# Patient Record
Sex: Male | Born: 1955 | Race: White | Hispanic: No | Marital: Married | Smoking: Former smoker
Health system: Southern US, Community
[De-identification: ages and names within clinical notes are randomized; demographics above are authoritative.]

## PROBLEM LIST (undated history)

## (undated) DIAGNOSIS — K635 Polyp of colon: Secondary | ICD-10-CM

## (undated) DIAGNOSIS — E291 Testicular hypofunction: Secondary | ICD-10-CM

## (undated) DIAGNOSIS — E8881 Metabolic syndrome: Secondary | ICD-10-CM

## (undated) DIAGNOSIS — E669 Obesity, unspecified: Secondary | ICD-10-CM

## (undated) DIAGNOSIS — G4733 Obstructive sleep apnea (adult) (pediatric): Secondary | ICD-10-CM

## (undated) DIAGNOSIS — R7301 Impaired fasting glucose: Secondary | ICD-10-CM

## (undated) DIAGNOSIS — I10 Essential (primary) hypertension: Secondary | ICD-10-CM

## (undated) DIAGNOSIS — M109 Gout, unspecified: Secondary | ICD-10-CM

## (undated) DIAGNOSIS — E785 Hyperlipidemia, unspecified: Secondary | ICD-10-CM

## (undated) DIAGNOSIS — J45909 Unspecified asthma, uncomplicated: Secondary | ICD-10-CM

## (undated) HISTORY — DX: Metabolic syndrome: E88.81

## (undated) HISTORY — DX: Polyp of colon: K63.5

## (undated) HISTORY — DX: Testicular hypofunction: E29.1

## (undated) HISTORY — DX: Obesity, unspecified: E66.9

## (undated) HISTORY — DX: Essential (primary) hypertension: I10

## (undated) HISTORY — DX: Obstructive sleep apnea (adult) (pediatric): G47.33

## (undated) HISTORY — DX: Hyperlipidemia, unspecified: E78.5

## (undated) HISTORY — PX: WISDOM TOOTH EXTRACTION: SHX21

## (undated) HISTORY — DX: Gout, unspecified: M10.9

## (undated) HISTORY — DX: Impaired fasting glucose: R73.01

---

## 2009-01-02 ENCOUNTER — Encounter (INDEPENDENT_AMBULATORY_CARE_PROVIDER_SITE_OTHER): Payer: Self-pay | Admitting: *Deleted

## 2009-09-02 ENCOUNTER — Telehealth (INDEPENDENT_AMBULATORY_CARE_PROVIDER_SITE_OTHER): Payer: Self-pay | Admitting: *Deleted

## 2010-05-12 NOTE — Progress Notes (Signed)
Summary: Schedule recall colonoscopy  Phone Note Outgoing Call Call back at [facility] Phone [phone]   Call placed by: Christie Nottingham CMA Duncan Dull),  Sep 02, 2009 11:54 AM Call placed to: Patient Summary of Call: Called pt to schedule recall colonoscopy and left a message with spouse for pt to call our office.  Initial call taken by: Christie Nottingham CMA Duncan Dull),  Sep 02, 2009 11:55 AM  Follow-up for Phone Call        left message for pt  to call back  Follow-up by: Christie Nottingham CMA Duncan Dull),  September 10, 2009 10:44 AM

## 2011-12-11 ENCOUNTER — Emergency Department (HOSPITAL_BASED_OUTPATIENT_CLINIC_OR_DEPARTMENT_OTHER): Payer: BC Managed Care – PPO

## 2011-12-11 ENCOUNTER — Encounter (HOSPITAL_BASED_OUTPATIENT_CLINIC_OR_DEPARTMENT_OTHER): Payer: Self-pay | Admitting: Emergency Medicine

## 2011-12-11 ENCOUNTER — Emergency Department (HOSPITAL_BASED_OUTPATIENT_CLINIC_OR_DEPARTMENT_OTHER)
Admission: EM | Admit: 2011-12-11 | Discharge: 2011-12-11 | Disposition: A | Discharge status: Home / Self Care | Payer: BC Managed Care – PPO | Attending: Emergency Medicine | Admitting: Emergency Medicine

## 2011-12-11 DIAGNOSIS — J45909 Unspecified asthma, uncomplicated: Secondary | ICD-10-CM | POA: Insufficient documentation

## 2011-12-11 DIAGNOSIS — Y998 Other external cause status: Secondary | ICD-10-CM | POA: Insufficient documentation

## 2011-12-11 DIAGNOSIS — S52123A Displaced fracture of head of unspecified radius, initial encounter for closed fracture: Secondary | ICD-10-CM | POA: Insufficient documentation

## 2011-12-11 DIAGNOSIS — Y9389 Activity, other specified: Secondary | ICD-10-CM | POA: Insufficient documentation

## 2011-12-11 DIAGNOSIS — IMO0002 Reserved for concepts with insufficient information to code with codable children: Secondary | ICD-10-CM | POA: Insufficient documentation

## 2011-12-11 HISTORY — DX: Unspecified asthma, uncomplicated: J45.909

## 2011-12-11 MED ORDER — OXYCODONE-ACETAMINOPHEN 5-325 MG PO TABS: 1.0000 | ORAL_TABLET | ORAL | Status: AC | PRN

## 2011-12-11 MED ORDER — TETANUS-DIPHTH-ACELL PERTUSSIS 5-2.5-18.5 LF-MCG/0.5 IM SUSP
0.5000 mL | Freq: Once | INTRAMUSCULAR | Status: AC
  Administered 2011-12-11: 0.5 mL via INTRAMUSCULAR
  Filled 2011-12-11: qty 0.5

## 2011-12-11 MED ORDER — OXYCODONE-ACETAMINOPHEN 5-325 MG PO TABS
2.0000 | ORAL_TABLET | Freq: Once | ORAL | Status: AC
  Administered 2011-12-11: 2 via ORAL
  Filled 2011-12-11 (×2): qty 2
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
## 2011-12-11 NOTE — ED Notes (Signed)
Patient requests pain medication before placement of splint. RN made aware
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
## 2013-09-07 IMAGING — CR DG ELBOW COMPLETE 3+V*R*
4 series · 4 of 4 positions shown · non-contrast
Comparison: None.

CLINICAL DATA: Trauma.  Pain.

RIGHT ELBOW - COMPLETE 3+ VIEW

[x elbow joint lat right]
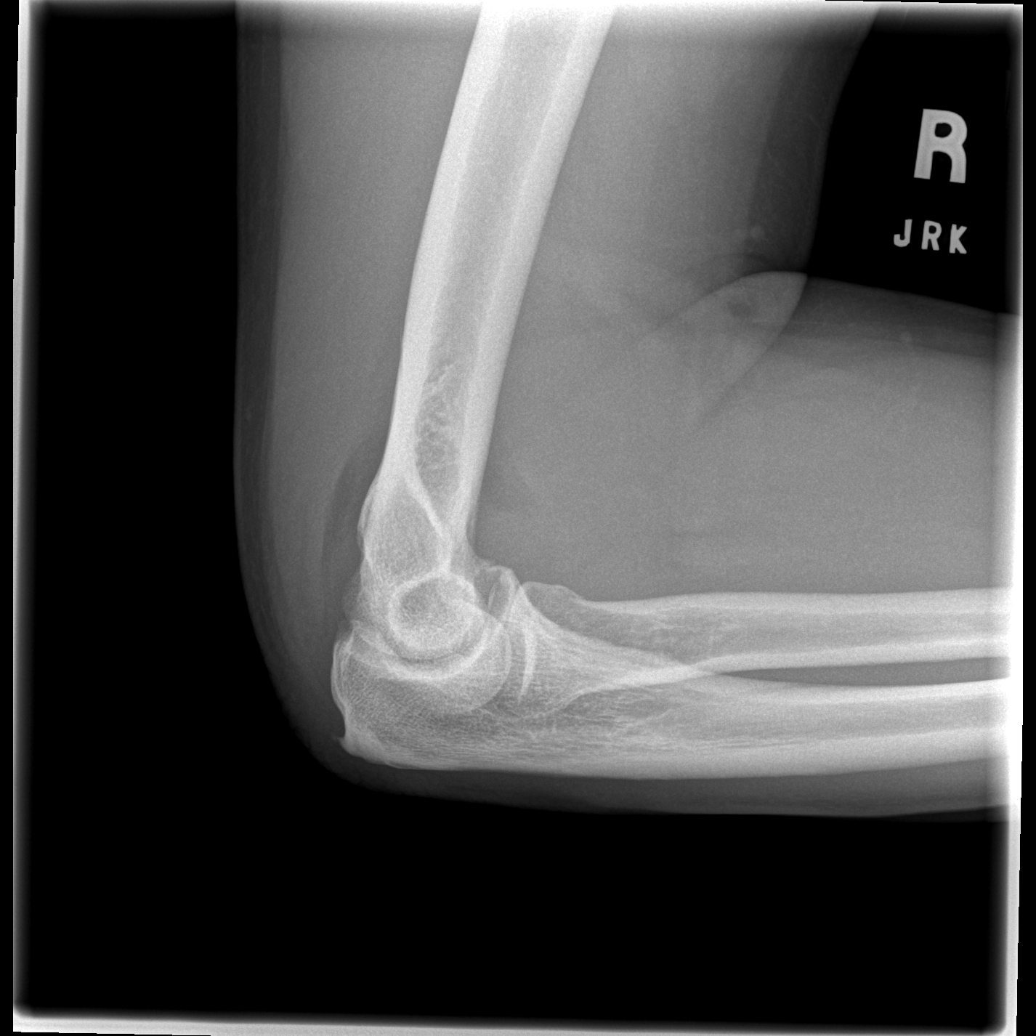

[x elbow joint ap right]
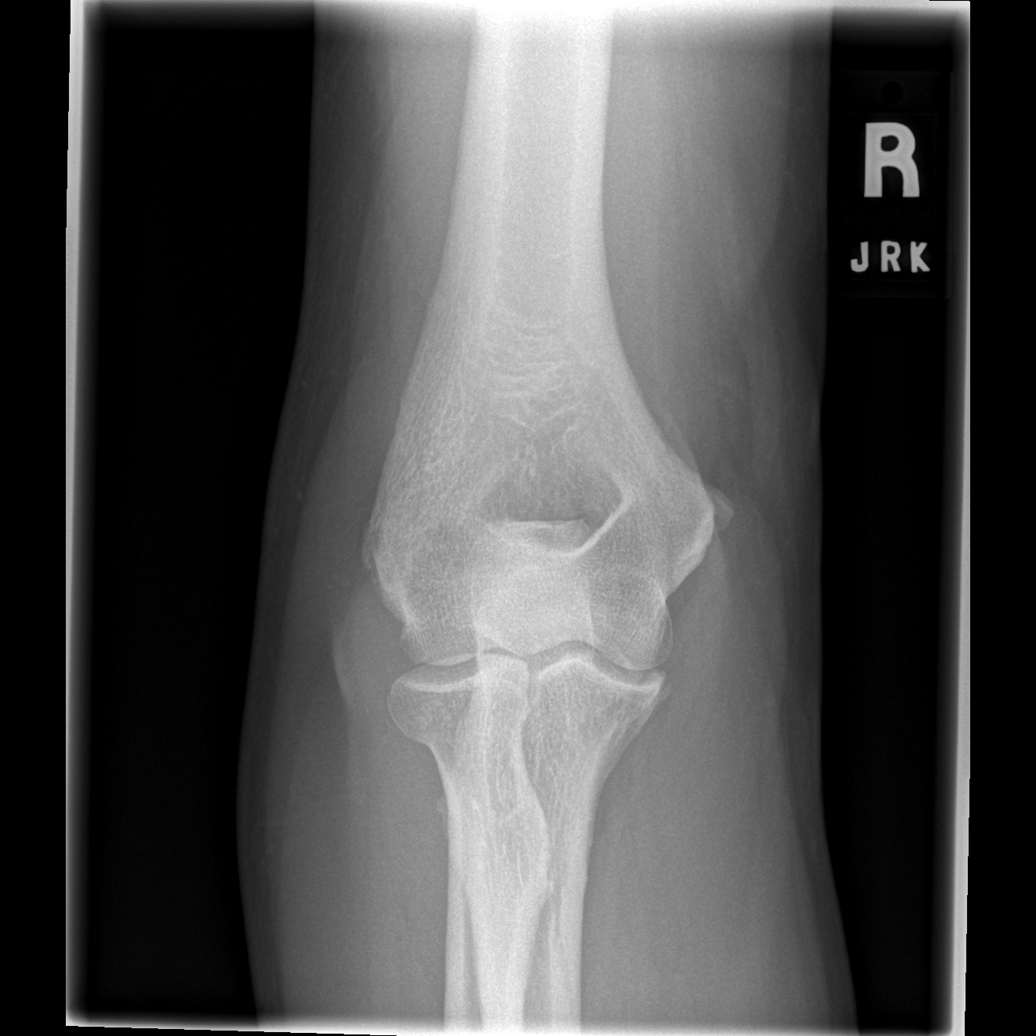

[x elbow joint obl. right (1 of 2)]
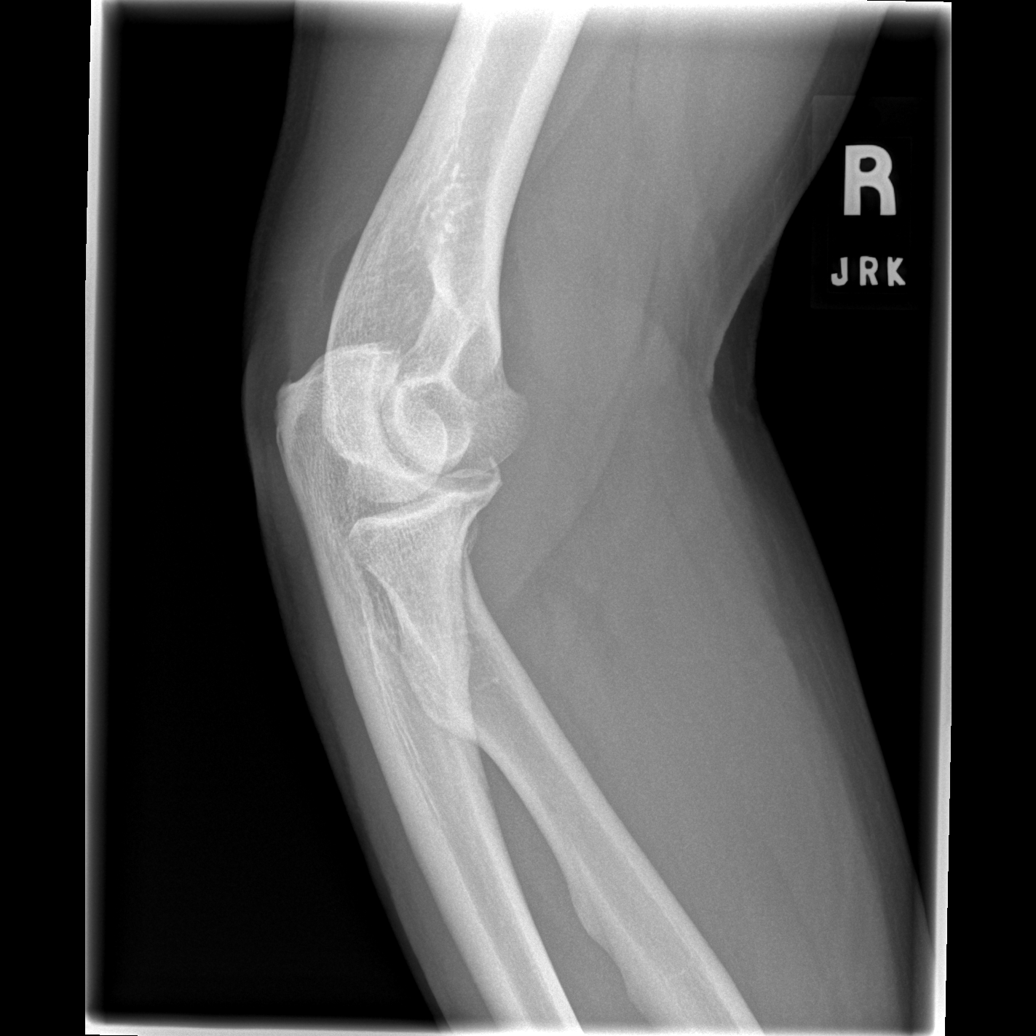

[x elbow joint obl. right (2 of 2)]
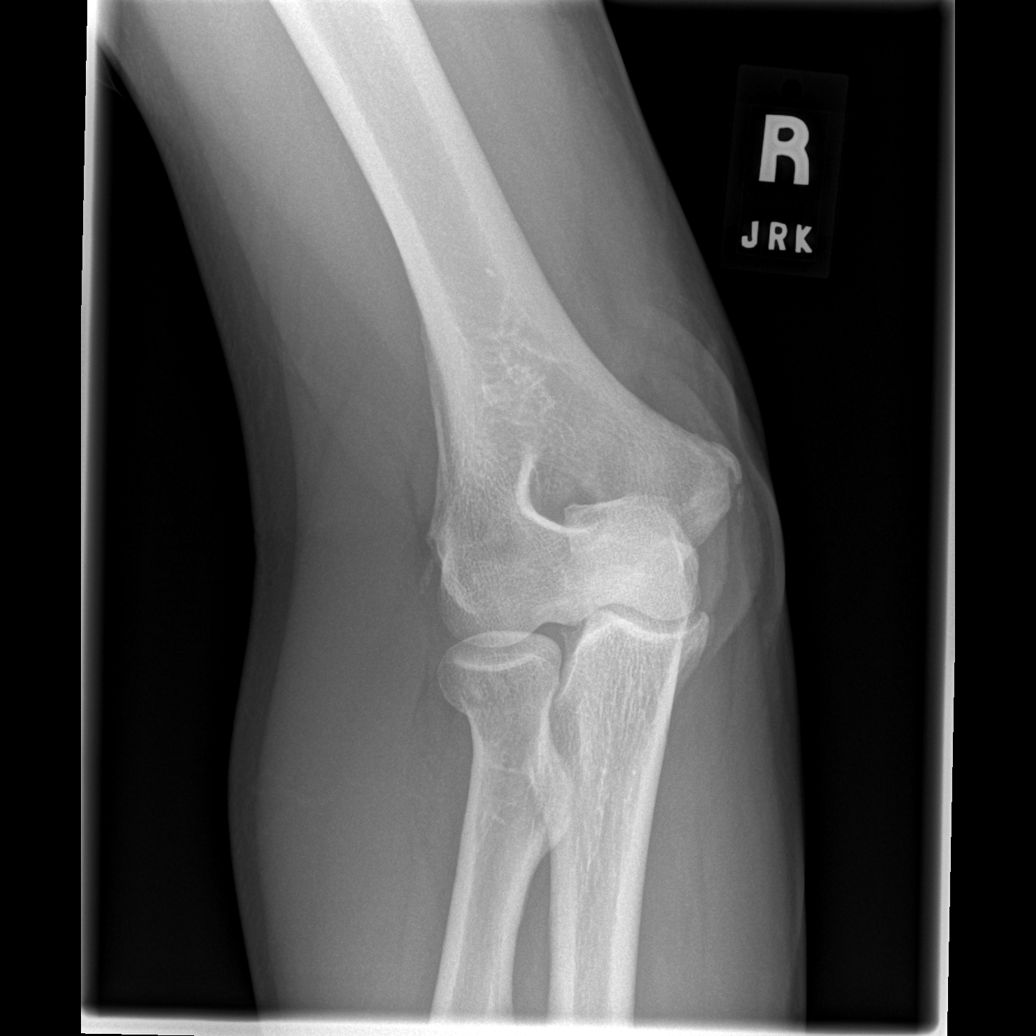

[4 of 4 positions shown; findings below may reference images not displayed]

FINDINGS: Enthesopathic change at the medial and lateral
epicondyles of the distal humerus.  Large joint effusion, with
elevation of the anterior and posterior fat pads.  Subtle osseous
irregularity at the radial head/neck junction is suspicious for a
minimally displaced fracture.  Degenerative changes at the triceps
insertion.
IMPRESSION: Large elbow joint effusion, favored to be secondary to a
nondisplaced to minimally displaced radial head/neck junction
fracture.

## 2015-01-15 ENCOUNTER — Ambulatory Visit (INDEPENDENT_AMBULATORY_CARE_PROVIDER_SITE_OTHER): Payer: BLUE CROSS/BLUE SHIELD | Admitting: Neurology

## 2015-01-15 ENCOUNTER — Encounter: Payer: Self-pay | Admitting: Neurology

## 2015-01-15 VITALS — BP 142/68 | HR 70 | Resp 16 | Ht 70.5 in | Wt 247.0 lb

## 2015-01-15 DIAGNOSIS — R351 Nocturia: Secondary | ICD-10-CM | POA: Diagnosis not present

## 2015-01-15 DIAGNOSIS — G4719 Other hypersomnia: Secondary | ICD-10-CM

## 2015-01-15 DIAGNOSIS — G4733 Obstructive sleep apnea (adult) (pediatric): Secondary | ICD-10-CM

## 2015-01-15 DIAGNOSIS — G2581 Restless legs syndrome: Secondary | ICD-10-CM

## 2015-01-15 DIAGNOSIS — E669 Obesity, unspecified: Secondary | ICD-10-CM

## 2015-01-15 NOTE — Progress Notes (Signed)
Subjective:    Patient ID: Jerry Mcgrath is a 59 y.o. male.  HPI     Star Age, MD, PhD [facility] [address] [place], [place] [address]  Dear Dr. Brigitte Pulse,   I saw your patient, Jerry Mcgrath, upon your kind request in my neurologic clinic today for initial consultation of his sleep disorder, in particular, concern for underlying obstructive sleep apnea and reevaluation of prior diagnosis of OSA. The patient is unaccompanied today. As you know, [name] is a 59 year old right-handed gentleman with an underlying medical history of hyperlipidemia, gout, hypertension, ED, hypogonadism, allergies and asthma, remote syncope over 10 years ago, radial head and wrist fracture in August 2013, as well as obesity, who was previously diagnosed with obstructive sleep apnea. He has not been on CPAP therapy. He has tried an oral appliance, but it has not helped his snoring. He reports snoring and excessive daytime somnolence. He had a sleep study about 6-7 years ago. CPAP was recommended but not pursued. Prior test results are not available for my review today. I reviewed your office note from 11/22/2014, which you kindly included. He has had recent blood work. Hemoglobin A1c was 5.6. His cholesterol was elevated with LDL at 140 and he has recently been started on Lipitor once daily. He quit smoking in May 2010, he works full-time and owns his own business. He drinks alcohol, about 2 drinks per day, drinks one cup of coffee per day. He gained weight after he quit smoking. His bedtime is usually around 11 PM to midnight. He does not have trouble falling asleep or staying asleep. He has nocturia usually once per night. He has occasional restless leg symptoms which are mild. His wife does not sleep in the same bedroom because of his loud snoring. He has a tendency to fall asleep inadvertently especially when watching TV at night. His rise time is around 7 AM. He feels  adequately rested first thing in the morning and denies morning headaches. He has no family history of OSA. He has not noticed any lower extremity swelling. He is not sure if he moves his legs or twitches his legs in his sleep.  His Past Medical History Is Significant For: Past Medical History  Diagnosis Date  . Asthma   . Hypertension   . IFG (impaired fasting glucose)   . Hyperlipemia   . Metabolic syndrome X   . Hypogonadism in male   . Gout   . OSA (obstructive sleep apnea)   . Obesity   . Colonic polyp     His Past Surgical History Is Significant For: No past surgical history on file.  His Family History Is Significant For: Family History  Problem Relation Age of Onset  . Alzheimer's disease Mother   . Diverticulitis Mother   . Heart attack Father     His Social History Is Significant For: Social History   Social History  . Marital Status: Married    Spouse Name: N/A  . Number of Children: N/A  . Years of Education: College   Occupational History  . [employer]    Social History Main Topics  . Smoking status: Former Research scientist (life sciences)  . Smokeless tobacco: Not on file     Comment: Quit 2010  . Alcohol Use: 0.0 oz/week    0 Standard drinks or equivalent per week     Comment: daily  . Drug Use: Not on file  . Sexual Activity: Not on  file   Other Topics Concern  . Not on file   Social History Narrative   Drink 1 cup of coffee a day     His Allergies Are:  No Known Allergies:   His Current Medications Are:  Outpatient Encounter Prescriptions as of 01/15/2015  Medication Sig  . atorvastatin (LIPITOR) 20 MG tablet   . DULERA 200-5 MCG/ACT AERO   . VIAGRA 100 MG tablet   . [DISCONTINUED] mometasone-formoterol (DULERA) 100-5 MCG/ACT AERO Inhale 2 puffs into the lungs 2 (two) times daily.  . [DISCONTINUED] testosterone (ANDROGEL) 50 MG/5GM GEL Place 5 g onto the skin daily.   No facility-administered encounter medications on file as of 01/15/2015.  :  Review  of Systems:  Out of a complete 14 point review of systems, all are reviewed and negative with the exception of these symptoms as listed below:  Review of Systems  Respiratory: Positive for wheezing.   Musculoskeletal:       Aching muscles   Neurological:       No trouble falling and staying asleep, snoring, witnessed apnea, daytime tiredness, denies taking naps during day.     Objective:  Neurologic Exam  Physical Exam Physical Examination:   Filed Vitals:   01/15/15 1446  BP: 142/68  Pulse: 70  Resp: 16    General Examination: The patient is a very pleasant 59 y.o. male in no acute distress. He appears well-developed and well-nourished and well groomed.   HEENT: Normocephalic, atraumatic, pupils are equal, round and reactive to light and accommodation. Funduscopic exam is normal with sharp disc margins noted. He may have very mild bilateral cataracts. Extraocular tracking is good without limitation to gaze excursion or nystagmus noted. Normal smooth pursuit is noted. Hearing is grossly intact. Face is symmetric with normal facial animation and normal facial sensation. Speech is clear with no dysarthria noted. There is no hypophonia. There is no lip, neck/head, jaw or voice tremor. Neck is supple with full range of passive and active motion. There are no carotid bruits on auscultation. Oropharynx exam reveals: mild mouth dryness, good dental hygiene and moderate airway crowding, due to thicker tongue, larger uvula, redundant soft palate, and tonsils in place. Tonsils are about 1+ bilaterally, Mallampati is class II. Neck circumference is 20-1/4 inches. Tongue protrudes centrally and palate elevates symmetrically.   Chest: Clear to auscultation without wheezing, rhonchi or crackles noted.  Heart: S1+S2+0, regular and normal without murmurs, rubs or gallops noted.   Abdomen: Soft, non-tender and non-distended with normal bowel sounds appreciated on auscultation.  Extremities: There is  no pitting edema in the distal lower extremities bilaterally. Pedal pulses are intact.  Skin: Warm and dry without trophic changes noted. There are no varicose veins.  Musculoskeletal: exam reveals no obvious joint deformities, tenderness or joint swelling or erythema.   Neurologically:  Mental status: The patient is awake, alert and oriented in all 4 spheres. His immediate and remote memory, attention, language skills and fund of knowledge are appropriate. There is no evidence of aphasia, agnosia, apraxia or anomia. Speech is clear with normal prosody and enunciation. Thought process is linear. Mood is normal and affect is normal.  Cranial nerves II - XII are as described above under HEENT exam. In addition: shoulder shrug is normal with equal shoulder height noted. Motor exam: Normal bulk, strength and tone is noted. There is no drift, tremor or rebound. Romberg is negative. Reflexes are 2+ throughout. Babinski: Toes are flexor bilaterally. Fine motor skills and coordination: intact  with normal finger taps, normal hand movements, normal rapid alternating patting, normal foot taps and normal foot agility.  Cerebellar testing: No dysmetria or intention tremor on finger to nose testing. Heel to shin is unremarkable bilaterally. There is no truncal or gait ataxia.  Sensory exam: intact to light touch, pinprick, vibration, temperature sense in the upper and lower extremities.  Gait, station and balance: He stands easily. No veering to one side is noted. No leaning to one side is noted. Posture is age-appropriate and stance is narrow based. Gait shows normal stride length and normal pace. No problems turning are noted. He turns en bloc. Tandem walk is unremarkable.   Assessment and Plan:  In summary, RODRICUS CANDELARIA is a very pleasant 59 y.o.-year old male with an underlying medical history of hyperlipidemia, gout, hypertension, ED, hypogonadism, allergies and asthma, remote syncope over 10 years ago,  radial head and wrist fracture in August 2013, as well as obesity, who was previously diagnosed with obstructive sleep apnea. His history and physical exam are in keeping with obstructive sleep apnea (OSA). I had a long chat with the patient about my findings and the diagnosis of OSA, its prognosis and treatment options. We talked about medical treatments, surgical interventions and non-pharmacological approaches. I explained in particular the risks and ramifications of untreated moderate to severe OSA, especially with respect to developing cardiovascular disease down the Road, including congestive heart failure, difficult to treat hypertension, cardiac arrhythmias, or stroke. Even type 2 diabetes has, in part, been linked to untreated OSA. Symptoms of untreated OSA include daytime sleepiness, memory problems, mood irritability and mood disorder such as depression and anxiety, lack of energy, as well as recurrent headaches, especially morning headaches. We talked about trying to maintain a healthy lifestyle in general, as well as the importance of weight control. I encouraged the patient to eat healthy, exercise daily and keep well hydrated, to keep a scheduled bedtime and wake time routine, to not skip any meals and eat healthy snacks in between meals. I advised the patient not to drive when feeling sleepy. I recommended the following at this time: sleep study with potential positive airway pressure titration. (We will score hypopneas at 3% and split the sleep study into diagnostic and treatment portion, if the estimated. 2 hour AHI is >15/h).   I explained the sleep test procedure to the patient and also outlined possible surgical and non-surgical treatment options of OSA, including the use of a custom-made dental device (which would require a referral to a specialist dentist or oral surgeon), upper airway surgical options, such as pillar implants, radiofrequency surgery, tongue base surgery, and UPPP (which  would involve a referral to an ENT surgeon). Rarely, jaw surgery such as mandibular advancement may be considered.  I also explained the CPAP treatment option to the patient, who indicated that he would be willing to try CPAP if the need arises. I explained the importance of being compliant with PAP treatment, not only for insurance purposes but primarily to improve His symptoms, and for the patient's long term health benefit, including to reduce His cardiovascular risks. I answered all his questions today and the patient was in agreement. I would like to see him back after the sleep study is completed and encouraged him to call with any interim questions, concerns, problems or updates.   Thank you very much for allowing me to participate in the care of this nice patient. If I can be of any further assistance to you please  do not hesitate to call me at [phone].  Sincerely,   Star Age, MD, PhD

## 2015-01-15 NOTE — Patient Instructions (Signed)

## 2015-01-23 ENCOUNTER — Institutional Professional Consult (permissible substitution): Payer: BLUE CROSS/BLUE SHIELD | Admitting: Neurology

## 2015-01-29 ENCOUNTER — Ambulatory Visit (INDEPENDENT_AMBULATORY_CARE_PROVIDER_SITE_OTHER): Payer: BLUE CROSS/BLUE SHIELD | Admitting: Neurology

## 2015-01-29 DIAGNOSIS — G479 Sleep disorder, unspecified: Secondary | ICD-10-CM

## 2015-01-29 DIAGNOSIS — G4733 Obstructive sleep apnea (adult) (pediatric): Secondary | ICD-10-CM | POA: Diagnosis not present

## 2015-01-29 DIAGNOSIS — G4761 Periodic limb movement disorder: Secondary | ICD-10-CM

## 2015-01-30 NOTE — Sleep Study (Signed)
Please see the scanned sleep study interpretation located in the procedure tab in the chart view section.  

## 2015-02-04 ENCOUNTER — Telehealth: Payer: Self-pay | Admitting: Neurology

## 2015-02-04 DIAGNOSIS — G4733 Obstructive sleep apnea (adult) (pediatric): Secondary | ICD-10-CM

## 2015-02-04 NOTE — Telephone Encounter (Signed)
Diana:  Patient referred by Dr. Brigitte Pulse, seen by me on 01/15/15, split study on 01/29/15, Ins: [id number]. Please call and notify patient that the recent sleep study confirmed the diagnosis of severe OSA. He did very well with CPAP during the study with significant improvement of the respiratory events. Therefore, I would like start the patient on CPAP therapy at home by prescribing a machine for home use. I placed the order in the chart. The patient will need a follow up appointment with me in 8 to 10 weeks post set up that has to be scheduled; please go ahead and schedule while you have the patient on the phone and make sure patient understands the importance of keeping this window for the FU appointment, as it is often an insurance requirement and failing to adhere to this may result in losing coverage for sleep apnea treatment.  Please re-enforce the importance of compliance with treatment and the need for [place] to monitor compliance data - again an insurance requirement and good feedback for the patient as far as how they are doing.  Also remind patient, that any upcoming CPAP machine or mask issues, should be first addressed with the DME company. Please ask if patient has a preference regarding DME company.  Please arrange for CPAP set up at home through a DME company of patient's choice - once you have spoken to the patient - and faxed/routed report to PCP and referring MD (if other than PCP), you can close this encounter, thanks,   Star Age, MD, PhD [facility] ([place])

## 2015-02-05 NOTE — Telephone Encounter (Signed)
Left message to call back  

## 2015-02-07 NOTE — Telephone Encounter (Signed)
Patient returned call, is aware Beverlee Nims out of the office until Monday 02/10/15. Please call [phone] on Monday 02/10/15.

## 2015-02-10 NOTE — Telephone Encounter (Signed)
I spoke to patient and gave him detailed results and recommendation from sleep study. He did not want to proceed with treatment at this time. He requested that the order be sent to him for review. He wants to make sure he gets the type of machine he wants. I explained to him that he can discuss with DME company about what kind of machine he would like. Patient stated that I can fax the information to him and faxed it to [phone]. I advised him to call back and let us know how he would like to proceed. Patient has also received copy of sleep study to see how severe his OSA is.

## 2015-02-10 NOTE — Telephone Encounter (Signed)
Patient called back and would like to see the machine first before purchasing. I advised him that I will send the order to [facility] and let them know that patient would like to see machine first before purchase. I will fax report to PCP. I will also email contact with [employer] to let her know.

## 2015-02-10 NOTE — Telephone Encounter (Signed)
Patient called. Would like for nurse or Dr. Rexene Alberts to call him back regarding what type of machine to get. Please call [phone].

## 2015-02-11 NOTE — Telephone Encounter (Signed)
Order sent to [place] to sign and has been sent back to [facility]

## 2015-04-28 ENCOUNTER — Telehealth: Payer: Self-pay | Admitting: Neurology

## 2015-04-28 NOTE — Telephone Encounter (Signed)
I spoke to [place] and he had already bought the piece at [employer]. He mad f/u appt for tomorrow for his initial CPAP f/u.

## 2015-04-28 NOTE — Telephone Encounter (Signed)
Pt called said he needs nose piece to mask. He got this from [facility] but he cannot find phone #, buidling where it was located is closed. Can you help with this?

## 2015-04-29 ENCOUNTER — Other Ambulatory Visit: Payer: Self-pay

## 2015-04-29 ENCOUNTER — Ambulatory Visit (INDEPENDENT_AMBULATORY_CARE_PROVIDER_SITE_OTHER): Payer: BLUE CROSS/BLUE SHIELD | Admitting: Neurology

## 2015-04-29 ENCOUNTER — Encounter: Payer: Self-pay | Admitting: Neurology

## 2015-04-29 VITALS — BP 158/68 | HR 62 | Resp 18 | Ht 70.5 in | Wt 248.0 lb

## 2015-04-29 DIAGNOSIS — Z9989 Dependence on other enabling machines and devices: Secondary | ICD-10-CM

## 2015-04-29 DIAGNOSIS — E669 Obesity, unspecified: Secondary | ICD-10-CM | POA: Diagnosis not present

## 2015-04-29 DIAGNOSIS — G4733 Obstructive sleep apnea (adult) (pediatric): Secondary | ICD-10-CM | POA: Diagnosis not present

## 2015-04-29 NOTE — Progress Notes (Signed)
Subjective:    Patient ID: Jerry Mcgrath is a 60 y.o. male.  HPI     Interim history:   Jerry Mcgrath is a 60 year old right-handed gentleman with an underlying medical history of hyperlipidemia, gout, hypertension, ED, hypogonadism, allergies and asthma, remote syncope over 10 years ago, radial head and wrist fracture in August 2013, as well as obesity, who presents for follow-up consultation of his obstructive sleep apnea, after his recent sleep study. The patient is unaccompanied today. I first met him on 01/15/2015 at the request of his primary care physician, at which time he reported a prior diagnosis of OSA and trying an oral appliance for treatment but no CPAP therapy before. I invited him back for sleep study. He had a split-night sleep study on 01/29/2015. I went over his test results with him in detail today. Baseline sleep efficiency was 68.6% with a sleep latency of 12.5 minutes and wake after sleep onset of 51 minutes with moderate sleep fragmentation noted. He had a markedly elevated arousal index. He had an increased percentage of stage II sleep, and 5% of REM sleep with a REM latency of 115.5 minutes. He had mild PLMS without arousals. He had no significant EKG or EEG changes. He had moderate snoring. Total AHI was 40.3 per hour, average oxygen saturation was 91%, nadir was 79%. He was then titrated on CPAP. Sleep efficiency was 73.8% with a sleep latency of 11.5 minutes and wake after sleep onset of 62.5 minutes. He had a normal arousal index. He had slow-wave sleep at 16.8% in rem sleep elevated at 35.1%. He had an average oxygen saturation of 91%, nadir was 85%. Snoring was eliminated. He had no significant PLMS or EKG or EEG changes. CPAP was titrated from 5 cm to 10 cm. AHI was 0 per hour at the final pressure with supine REM sleep achieved. O2 nadir was 90% on the final pressure. Based on his test results are prescribed CPAP therapy for home use.   Today, 04/29/2015: I reviewed his  CPAP compliance data from 03/28/2015 through 04/26/2015 which is a total of 30 days during which time he used his machine 28 days with percent used days greater than 4 hours at 93%, indicating excellent compliance with an average usage of 6 hours and 8 minutes, residual AHI 1.5 per hour, leak low with the 95th percentile at 5.1 L/m on a pressure of 10 cm with EPR of 3.   Today, 04/29/2015: He reports doing well, using nasal pillows, feels better rested and sleep is more consolidated. He has had no interim illness or medication changes with the exception of a recent cold and cough symptoms.   Previously:   01/15/2015: He was previously diagnosed with obstructive sleep apnea. He has not been on CPAP therapy. He has tried an oral appliance, but it has not helped his snoring. He reports snoring and excessive daytime somnolence. He had a sleep study about 6-7 years ago. CPAP was recommended but not pursued. Prior test results are not available for my review today. I reviewed your office note from 11/22/2014, which you kindly included. He has had recent blood work. Hemoglobin A1c was 5.6. His cholesterol was elevated with LDL at 140 and he has recently been started on Lipitor once daily. He quit smoking in May 2010, he works full-time and owns his own business. He drinks alcohol, about 2 drinks per day, drinks one cup of coffee per day. He gained weight after he quit smoking. His bedtime is  usually around 11 PM to midnight. He does not have trouble falling asleep or staying asleep. He has nocturia usually once per night. He has occasional restless leg symptoms which are mild. His wife does not sleep in the same bedroom because of his loud snoring. He has a tendency to fall asleep inadvertently especially when watching TV at night. His rise time is around 7 AM. He feels adequately rested first thing in the morning and denies morning headaches. He has no family history of OSA. He has not noticed any lower extremity  swelling. He is not sure if he moves his legs or twitches his legs in his sleep.  His Past Medical History Is Significant For: Past Medical History  Diagnosis Date  . Asthma   . Hypertension   . IFG (impaired fasting glucose)   . Hyperlipemia   . Metabolic syndrome X   . Hypogonadism in male   . Gout   . OSA (obstructive sleep apnea)   . Obesity   . Colonic polyp     His Past Surgical History Is Significant For: No past surgical history on file.  His Family History Is Significant For: Family History  Problem Relation Age of Onset  . Alzheimer's disease Mother   . Diverticulitis Mother   . Heart attack Father     His Social History Is Significant For: Social History   Social History  . Marital Status: Married    Spouse Name: N/A  . Number of Children: N/A  . Years of Education: College   Occupational History  . [employer]    Social History Main Topics  . Smoking status: Former Research scientist (life sciences)  . Smokeless tobacco: None     Comment: Quit 2010  . Alcohol Use: 0.0 oz/week    0 Standard drinks or equivalent per week     Comment: daily  . Drug Use: None  . Sexual Activity: Not Asked   Other Topics Concern  . None   Social History Narrative   Drink 1 cup of coffee a day     His Allergies Are:  No Known Allergies:   His Current Medications Are:  Outpatient Encounter Prescriptions as of 04/29/2015  Medication Sig  . atorvastatin (LIPITOR) 20 MG tablet   . DULERA 200-5 MCG/ACT AERO   . VIAGRA 100 MG tablet    No facility-administered encounter medications on file as of 04/29/2015.  :  Review of Systems:  Out of a complete 14 point review of systems, all are reviewed and negative with the exception of these symptoms as listed below:   Review of Systems  Neurological:       Patient is here for initial CPAP f/u. Denies any new concerns or problems. Reports that a nose piece broke from his mask but has since replaced it.     Objective:  Neurologic  Exam  Physical Exam Physical Examination:   Filed Vitals:   04/29/15 0827  BP: 158/68  Pulse: 62  Resp: 18   General Examination: The patient is a very pleasant 60 y.o. male in no acute distress. He appears well-developed and well-nourished and well groomed.   HEENT: Normocephalic, atraumatic, pupils are equal, round and reactive to light and accommodation. He has mild bilateral cataracts. Extraocular tracking is good without limitation to gaze excursion or nystagmus noted. Normal smooth pursuit is noted. Hearing is grossly intact. Face is symmetric with normal facial animation and normal facial sensation. Speech is clear with no dysarthria noted. There is no  hypophonia. There is no lip, neck/head, jaw or voice tremor. Neck is supple with full range of passive and active motion. There are no carotid bruits on auscultation. Oropharynx exam reveals: mild mouth dryness, good dental hygiene and moderate airway crowding, due to thicker tongue, larger uvula, redundant soft palate, and tonsils in place. Tonsils are about 1+ bilaterally, Mallampati is class II. Tongue protrudes centrally and palate elevates symmetrically.   Chest: Clear to auscultation without wheezing, rhonchi or crackles noted.  Heart: S1+S2+0, regular and normal without murmurs, rubs or gallops noted.   Abdomen: Soft, non-tender and non-distended with normal bowel sounds appreciated on auscultation.  Extremities: There is no pitting edema in the distal lower extremities bilaterally. Pedal pulses are intact.  Skin: Warm and dry without trophic changes noted. There are no varicose veins.  Musculoskeletal: exam reveals no obvious joint deformities, tenderness or joint swelling or erythema.   Neurologically:  Mental status: The patient is awake, alert and oriented in all 4 spheres. His immediate and remote memory, attention, language skills and fund of knowledge are appropriate. There is no evidence of aphasia, agnosia, apraxia or  anomia. Speech is clear with normal prosody and enunciation. Thought process is linear. Mood is normal and affect is normal.  Cranial nerves II - XII are as described above under HEENT exam. In addition: shoulder shrug is normal with equal shoulder height noted. Motor exam: Normal bulk, strength and tone is noted. There is no drift, tremor or rebound. Romberg is negative. Reflexes are 2+ throughout. Fine motor skills and coordination: intact with normal finger taps, normal hand movements, normal rapid alternating patting, normal foot taps and normal foot agility.  Cerebellar testing: No dysmetria or intention tremor on finger to nose testing. Heel to shin is unremarkable bilaterally. There is no truncal or gait ataxia.  Sensory exam: intact to light touch in the upper and lower extremities.  Gait, station and balance: He stands easily. No veering to one side is noted. No leaning to one side is noted. Posture is age-appropriate and stance is narrow based. Gait shows normal stride length and normal pace. No problems turning are noted. He turns en bloc. Tandem walk is unremarkable.   Assessment and Plan:  In summary, Jerry Mcgrath is a very pleasant 60 year old male with an underlying medical history of hyperlipidemia, gout, hypertension, ED, hypogonadism, allergies and asthma, remote syncope over 10 years ago, radial head and wrist fracture in August 2013, as well as obesity, who was previously diagnosed with obstructive sleep apnea. His  recent split-night sleep study from October 2016 was discussed in detail. His exam is stable. He has established CPAP therapy at home and a pressure of 10 cm via nasal pillows. He is tolerating this well and is compliant with treatment. He indicates improvement of his sleep. He is commended for his treatment adherence. He is motivated to continue to use CPAP and is also motivated to lose weight. We talked about the risks and ramifications of untreated OSA. We talked about  the importance of continuation with CPAP therapy. Since he is doing well at this time, I would like to see him back in 6 months, then yearly thereafter if he continues to do well with treatment. I answered all his questions today and the patient was in agreement.  I spent 20 minutes in total face-to-face time with the patient, more than 50% of which was spent in counseling and coordination of care, reviewing test results, reviewing medication and discussing or reviewing  the diagnosis of OSA, its prognosis and treatment options.

## 2015-04-29 NOTE — Patient Instructions (Signed)

## 2015-05-06 ENCOUNTER — Other Ambulatory Visit: Payer: Self-pay

## 2015-05-07 ENCOUNTER — Other Ambulatory Visit: Payer: Self-pay | Admitting: Neurology

## 2015-05-07 MED ORDER — DULERA 200-5 MCG/ACT IN AERO: 2.0000 | INHALATION_SPRAY | Freq: Two times a day (BID) | RESPIRATORY_TRACT | Status: DC

## 2015-08-27 DIAGNOSIS — M7541 Impingement syndrome of right shoulder: Secondary | ICD-10-CM | POA: Diagnosis not present

## 2015-08-27 DIAGNOSIS — M25511 Pain in right shoulder: Secondary | ICD-10-CM | POA: Diagnosis not present

## 2015-11-10 ENCOUNTER — Encounter: Payer: Self-pay | Admitting: Neurology

## 2015-11-10 ENCOUNTER — Ambulatory Visit (INDEPENDENT_AMBULATORY_CARE_PROVIDER_SITE_OTHER): Payer: BLUE CROSS/BLUE SHIELD | Admitting: Neurology

## 2015-11-10 VITALS — BP 150/68 | HR 60 | Resp 18 | Ht 70.5 in | Wt 247.0 lb

## 2015-11-10 DIAGNOSIS — E669 Obesity, unspecified: Secondary | ICD-10-CM

## 2015-11-10 DIAGNOSIS — Z9989 Dependence on other enabling machines and devices: Secondary | ICD-10-CM

## 2015-11-10 DIAGNOSIS — G4733 Obstructive sleep apnea (adult) (pediatric): Secondary | ICD-10-CM

## 2015-11-10 NOTE — Patient Instructions (Signed)
Keep up the good work! I will see you back in 12 months for sleep apnea check up.   

## 2015-11-10 NOTE — Progress Notes (Signed)
Subjective:    Patient ID: Jerry Mcgrath is a 60 y.o. male.  HPI     Interim history:   Jerry Mcgrath is a 60 year old right-handed gentleman with an underlying medical history of hyperlipidemia, gout, hypertension, ED, hypogonadism, allergies and asthma, remote syncope over 10 years ago, radial head and wrist fracture in August 2013, as well as obesity, who presents for follow-up consultation of his obstructive sleep apnea, Well established on CPAP therapy. The patient is unaccompanied today. I last saw Jerry Mcgrath on 04/29/2015, at which time we talked about his split-night sleep study results from October 2016 which confirmed his prior diagnosis of OSA. Jerry Mcgrath was compliant with CPAP therapy.  Today, 11/10/2015: Jerry Mcgrath reports doing well. Jerry Mcgrath is compliant with CPAP therapy. I reviewed his CPAP compliance data from 10/07/2015 through 11/05/2015 which is a total of 30 days during which time Jerry Mcgrath used his machine every night with percent used days greater than 4 hours at 97%, indicating excellent compliance with an average usage of 6 hours and 21 minutes, residual AHI 2.2 per hour, leak low, pressure at 10 cm with EPR of 3. Jerry Mcgrath uses nasal pillows. Jerry Mcgrath has had some cold symptoms in the past few days. Jerry Mcgrath is advised to skip a night or 2 when Jerry Mcgrath is congested and has significant postnasal drip. Jerry Mcgrath can use a nasal saline rinse if tolerated. Jerry Mcgrath has no new complaints and no new changes in his medications. Weight has been stable.  Previously:  I first met Jerry Mcgrath on 01/15/2015 at the request of his primary care physician, at which time Jerry Mcgrath reported a prior diagnosis of OSA and trying an oral appliance for treatment but no CPAP therapy before. I invited Jerry Mcgrath back for sleep study. Jerry Mcgrath had a split-night sleep study on 01/29/2015. I went over his test results with Jerry Mcgrath in detail today. Baseline sleep efficiency was 68.6% with a sleep latency of 12.5 minutes and wake after sleep onset of 51 minutes with moderate sleep fragmentation noted. Jerry Mcgrath  had a markedly elevated arousal index. Jerry Mcgrath had an increased percentage of stage II sleep, and 5% of REM sleep with a REM latency of 115.5 minutes. Jerry Mcgrath had mild PLMS without arousals. Jerry Mcgrath had no significant EKG or EEG changes. Jerry Mcgrath had moderate snoring. Total AHI was 40.3 per hour, average oxygen saturation was 91%, nadir was 79%. Jerry Mcgrath was then titrated on CPAP. Sleep efficiency was 73.8% with a sleep latency of 11.5 minutes and wake after sleep onset of 62.5 minutes. Jerry Mcgrath had a normal arousal index. Jerry Mcgrath had slow-wave sleep at 16.8% in rem sleep elevated at 35.1%. Jerry Mcgrath had an average oxygen saturation of 91%, nadir was 85%. Snoring was eliminated. Jerry Mcgrath had no significant PLMS or EKG or EEG changes. CPAP was titrated from 5 cm to 10 cm. AHI was 0 per hour at the final pressure with supine REM sleep achieved. O2 nadir was 90% on the final pressure. Based on his test results are prescribed CPAP therapy for home use.   I reviewed his CPAP compliance data from 03/28/2015 through 04/26/2015 which is a total of 30 days during which time Jerry Mcgrath used his machine 28 days with percent used days greater than 4 hours at 93%, indicating excellent compliance with an average usage of 6 hours and 8 minutes, residual AHI 1.5 per hour, leak low with the 95th percentile at 5.1 L/m on a pressure of 10 cm with EPR of 3.   01/15/2015: Jerry Mcgrath was previously diagnosed with obstructive sleep apnea. Jerry Mcgrath has  not been on CPAP therapy. Jerry Mcgrath has tried an oral appliance, but it has not helped his snoring. Jerry Mcgrath reports snoring and excessive daytime somnolence. Jerry Mcgrath had a sleep study about 6-7 years ago. CPAP was recommended but not pursued. Prior test results are not available for my review today. I reviewed your office note from 11/22/2014, which you kindly included. Jerry Mcgrath has had recent blood work. Hemoglobin A1c was 5.6. His cholesterol was elevated with LDL at 140 and Jerry Mcgrath has recently been started on Lipitor once daily. 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 Jerry Mcgrath drinks alcohol, about 2 drinks per day, drinks one cup of coffee per day. Jerry Mcgrath gained weight after Jerry Mcgrath quit smoking. His bedtime is usually around 11 PM to midnight. Jerry Mcgrath does not have trouble falling asleep or staying asleep. Jerry Mcgrath has nocturia usually once per night. Jerry Mcgrath has occasional restless leg symptoms which are mild. His wife does not sleep in the same bedroom because of his loud snoring. Jerry Mcgrath has a tendency to fall asleep inadvertently especially when watching TV at night. His rise time is around 7 AM. Jerry Mcgrath feels adequately rested first thing in the morning and denies morning headaches. Jerry Mcgrath has no family history of OSA. Jerry Mcgrath has not noticed any lower extremity swelling. Jerry Mcgrath is not sure if Jerry Mcgrath moves his legs or twitches his legs in his sleep.   Her Past Medical History Is Significant For: Past Medical History:  Diagnosis Date  . Asthma   . Colonic polyp   . Gout   . Hyperlipemia   . Hypertension   . Hypogonadism in male   . IFG (impaired fasting glucose)   . Metabolic syndrome X   . Obesity   . OSA (obstructive sleep apnea)     Her Past Surgical History Is Significant For: No past surgical history on file.  Her Family History Is Significant For: Family History  Problem Relation Age of Onset  . Alzheimer's disease Mother   . Diverticulitis Mother   . Heart attack Father     Her Social History Is Significant For: Social History   Social History  . Marital status: Married    Spouse name: N/A  . Number of children: N/A  . Years of education: College   Occupational History  . [employer]    Social History Main Topics  . Smoking status: Former Research scientist (life sciences)  . Smokeless tobacco: None     Comment: Quit 2010  . Alcohol use 0.0 oz/week     Comment: daily  . Drug use: Unknown  . Sexual activity: Not Asked   Other Topics Concern  . None   Social History Narrative   Drink 1 cup of coffee a day     Her Allergies Are:  No Known Allergies:   Her Current  Medications Are:  Outpatient Encounter Prescriptions as of 11/10/2015  Medication Sig  . atorvastatin (LIPITOR) 20 MG tablet   . DULERA 200-5 MCG/ACT AERO Inhale 2 puffs into the lungs 2 (two) times daily.  Marland Kitchen VIAGRA 100 MG tablet    No facility-administered encounter medications on file as of 11/10/2015.   :  Review of Systems:  Out of a complete 14 point review of systems, all are reviewed and negative with the exception of these symptoms as listed below:   Review of Systems  Neurological:       Patient is here for CPAP f/u. States that Jerry Mcgrath is doing well with it and  no new concerns.     Objective:  Neurologic Exam  Physical Exam Physical Examination:   Vitals:   11/10/15 0835  BP: (!) 150/68  Pulse: 60  Resp: 18   General Examination: The patient is a very pleasant 60 y.o. male in no acute distress. Jerry Mcgrath appears well-developed and well-nourished and well groomed.   HEENT: Normocephalic, atraumatic, pupils are equal, round and reactive to light and accommodation. Jerry Mcgrath has mild bilateral cataracts. Extraocular tracking is good without limitation to gaze excursion or nystagmus noted. Normal smooth pursuit is noted. Hearing is grossly intact. Face is symmetric with normal facial animation and normal facial sensation. Speech is clear with no dysarthria noted. There is no hypophonia. There is no lip, neck/head, jaw or voice tremor. Neck is supple with full range of passive and active motion. There are no carotid bruits on auscultation. Oropharynx exam reveals: mild mouth dryness, good dental hygiene and moderate airway crowding, due to thicker tongue, larger uvula, redundant soft palate, and tonsils in place. Tonsils are about 1+ bilaterally, Mallampati is class II. Tongue protrudes centrally and palate elevates symmetrically. Nasal passages show mild nasal mucosal erythema, no significant swelling.  Chest: Clear to auscultation without wheezing, rhonchi or crackles noted.  Heart: S1+S2+0,  regular and normal without murmurs, rubs or gallops noted.   Abdomen: Soft, non-tender and non-distended with normal bowel sounds appreciated on auscultation.  Extremities: There is no pitting edema in the distal lower extremities bilaterally. Pedal pulses are intact.  Skin: Warm and dry without trophic changes noted. There are no varicose veins.  Musculoskeletal: exam reveals no obvious joint deformities, tenderness or joint swelling or erythema.   Neurologically:  Mental status: The patient is awake, alert and oriented in all 4 spheres. His immediate and remote memory, attention, language skills and fund of knowledge are appropriate. There is no evidence of aphasia, agnosia, apraxia or anomia. Speech is clear with normal prosody and enunciation. Thought process is linear. Mood is normal and affect is normal.  Cranial nerves II - XII are as described above under HEENT exam. In addition: shoulder shrug is normal with equal shoulder height noted. Motor exam: Normal bulk, strength and tone is noted. There is no drift, tremor or rebound. Romberg is negative. Fine motor skills intact. There is no truncal or gait ataxia.  Sensory exam: intact to light touch in the upper and lower extremities.  Gait, station and balance: Jerry Mcgrath stands easily. No veering to one side is noted. No leaning to one side is noted. Posture is age-appropriate and stance is narrow based. Gait shows normal stride length and normal pace. No problems turning are noted. Tandem walk is unremarkable.   Assessment and Plan:  In summary, Jerry Mcgrath is a very pleasant 60 year old male with an underlying medical history of hyperlipidemia, gout, hypertension, ED, hypogonadism, allergies and asthma, remote syncope over 10 years ago, radial head and wrist fracture in August 2013, as well as obesity, who was previously diagnosed with obstructive sleep apnea. Jerry Mcgrath had a split-night sleep study on 01/29/2015, which confirmed the diagnosis of  severe OSA, Jerry Mcgrath was well treated with CPAP at 10 cm and continues to be compliant with it. We reviewed his most recent compliance data from the past 30 days. Jerry Mcgrath has no new issues, exam is stable, compliance excellent. At this juncture I suggested a one-year checkup routinely, sooner as needed. Answered all his questions today and Jerry Mcgrath was in agreement.  I spent 15 minutes in total face-to-face time  with the patient, more than 50% of which was spent in counseling and coordination of care, reviewing test results, reviewing medication and discussing or reviewing the diagnosis of OSA, its prognosis and treatment options.

## 2015-12-02 ENCOUNTER — Encounter: Payer: Self-pay | Admitting: Allergy and Immunology

## 2015-12-02 ENCOUNTER — Ambulatory Visit (INDEPENDENT_AMBULATORY_CARE_PROVIDER_SITE_OTHER): Payer: BLUE CROSS/BLUE SHIELD | Admitting: Allergy and Immunology

## 2015-12-02 VITALS — BP 140/80 | HR 60 | Resp 16

## 2015-12-02 DIAGNOSIS — J45909 Unspecified asthma, uncomplicated: Secondary | ICD-10-CM | POA: Diagnosis not present

## 2015-12-02 DIAGNOSIS — J329 Chronic sinusitis, unspecified: Secondary | ICD-10-CM | POA: Diagnosis not present

## 2015-12-02 DIAGNOSIS — J449 Chronic obstructive pulmonary disease, unspecified: Secondary | ICD-10-CM

## 2015-12-02 DIAGNOSIS — J309 Allergic rhinitis, unspecified: Secondary | ICD-10-CM

## 2015-12-02 DIAGNOSIS — H1010 Acute atopic conjunctivitis, unspecified eye: Secondary | ICD-10-CM | POA: Diagnosis not present

## 2015-12-02 MED ORDER — AMOXICILLIN-POT CLAVULANATE 875-125 MG PO TABS: ORAL_TABLET | ORAL | 0 refills | Status: DC

## 2015-12-02 MED ORDER — METHYLPREDNISOLONE ACETATE 80 MG/ML IJ SUSP
80.0000 mg | Freq: Once | INTRAMUSCULAR | Status: AC
  Administered 2015-12-02: 80 mg via INTRAMUSCULAR

## 2015-12-02 MED ORDER — MONTELUKAST SODIUM 10 MG PO TABS: ORAL_TABLET | ORAL | 5 refills | Status: DC

## 2015-12-02 NOTE — Patient Instructions (Addendum)
  1. Augmentin 875 one tablet twice a day for the next 14 days  2. Depo-Medrol 80 IM delivering clinic today  3. OTC Rhinocort one spray each nostril one time per day  4. Montelukast 10 mg one tablet one time per day  5. ProAir HFA 2 puffs every 4-6 hours if needed  6. Return to clinic in 3 weeks or earlier if problem  7. Obtain fall flu vaccine  8. Reflux?

## 2015-12-02 NOTE — Progress Notes (Signed)
Follow-up Note  Referring Provider: Marton Redwood, MD Primary Provider: Marton Redwood, MD Date of Office Visit: 12/02/2015  Subjective:   Jerry Mcgrath (DOB: 04-23-1955) is a 60 y.o. male who returns to the [facility] on 12/02/2015 in re-evaluation of the following:  HPI: Jerry Mcgrath returns to this clinic in evaluation of respiratory tract problems that developed over the course of the past month. I had last seen him in this clinic in August 2015 at which point in time he appeared to have a component of COPD with asthma and the possibility of reflux-induced respiratory disease in conjunction with allergic rhinoconjunctivitis.  One month ago he developed sore throat and postnasal drip and throat clearing and nasal congestion and yellow nasal discharge. Although his yellow nasal discharge has cleared he still remains with significant problems involving his throat and head. He's finding it somewhat difficult to swallow because of sore throat.  He is always had problems with throat clearing and mucus stuck in his throat and at one point in time we did suggest that he get treatment for LPR but that treatment never was completely carried out although he did modify his caffeine consumption to some degree and is only drinking 1 cup coffee per day at this point in time. He does not have any classic symptoms of reflux.  He had significant inflammation of his airway when he was initially seen in this clinic but he has done much better at this point time and no longer uses an inhaled steroid. He does not use a short-acting bronchodilator as well. He's been placed on CPAP recently for the past year and he thinks that this is helped his breathing. He's had documented sleep apnea for about 6 years if not longer.    Medication List      atorvastatin 20 MG tablet Commonly known as:  LIPITOR   VIAGRA 100 MG tablet Generic drug:  sildenafil       Past Medical History:  Diagnosis Date   . Asthma   . Colonic polyp   . Gout   . Hyperlipemia   . Hypertension   . Hypogonadism in male   . IFG (impaired fasting glucose)   . Metabolic syndrome X   . Obesity   . OSA (obstructive sleep apnea)     History reviewed. No pertinent surgical history.  No Known Allergies  Review of systems negative except as noted in HPI / PMHx or noted below:  Review of Systems  Constitutional: Negative.   HENT: Negative.   Eyes: Negative.   Respiratory: Negative.   Cardiovascular: Negative.   Gastrointestinal: Negative.   Genitourinary: Negative.   Musculoskeletal: Negative.   Skin: Negative.   Neurological: Negative.   Endo/Heme/Allergies: Negative.   Psychiatric/Behavioral: Negative.      Objective:   Vitals:   12/02/15 1726  BP: 140/80  Pulse: 60  Resp: 16          Physical Exam  Constitutional: He is well-developed, well-nourished, and in no distress.  Throat clearing, slightly nasal voice  HENT:  Head: Normocephalic.  Right Ear: Tympanic membrane, external ear and ear canal normal.  Left Ear: Tympanic membrane, external ear and ear canal normal.  Nose: Nose normal. No mucosal edema or rhinorrhea.  Mouth/Throat: Uvula is midline and mucous membranes are normal. Posterior oropharyngeal erythema present. No oropharyngeal exudate.  Eyes: Conjunctivae are normal.  Neck: Trachea normal. No tracheal tenderness present. No tracheal deviation present. No thyromegaly present.  Cardiovascular:  Normal rate, regular rhythm, S1 normal, S2 normal and normal heart sounds.   No murmur heard. Pulmonary/Chest: Breath sounds normal. No stridor. No respiratory distress. He has no wheezes. He has no rales.  Musculoskeletal: He exhibits no edema.  Lymphadenopathy:       Head (right side): No tonsillar adenopathy present.       Head (left side): No tonsillar adenopathy present.    He has no cervical adenopathy.  Neurological: He is alert. Gait normal.  Skin: No rash noted. He is  not diaphoretic. No erythema. Nails show no clubbing.  Psychiatric: Mood and affect normal.    Diagnostics:    Spirometry was performed and demonstrated an FEV1 of 2.44 at 66 % of predicted.  The patient had an Asthma Control Test with the following results: ACT Total Score: 24.    Assessment and Plan:   1. Chronic sinusitis, unspecified location   2. COPD with asthma ([place])   3. Allergic rhinoconjunctivitis     1. Augmentin 875 one tablet twice a day for the next 14 days  2. Depo-Medrol 80 IM delivering clinic today  3. OTC Rhinocort one spray each nostril one time per day  4. Montelukast 10 mg one tablet one time per day  5. ProAir HFA 2 puffs every 4-6 hours if needed  6. Return to clinic in 3 weeks or earlier if problem  7. Obtain fall flu vaccine  8. Reflux?  I will assume that Jerry Mcgrath has a persistent infection of his upper airway and treat him with the therapy mentioned above which includes antibiotics and some anti-swelling medications for his respiratory tract. If he still continues to have significant problems that I think he may need to see an ear nose and throat physician for a visualization of his upper airway and his laryngeal structure to help discern whether or not reflux is playing a role here or some other pathology is playing a role. For his COPD with component of asthma i think there is no need for him to use any therapy at this point time as he is completely asymptomatic and never uses a short acting bronchodilator. Certainly if his pattern changes in the future he may require a different approach to this problem.  Allena Katz, MD [place]

## 2015-12-03 DIAGNOSIS — Z125 Encounter for screening for malignant neoplasm of prostate: Secondary | ICD-10-CM | POA: Diagnosis not present

## 2015-12-03 DIAGNOSIS — I10 Essential (primary) hypertension: Secondary | ICD-10-CM | POA: Diagnosis not present

## 2015-12-03 DIAGNOSIS — E784 Other hyperlipidemia: Secondary | ICD-10-CM | POA: Diagnosis not present

## 2015-12-03 DIAGNOSIS — R7301 Impaired fasting glucose: Secondary | ICD-10-CM | POA: Diagnosis not present

## 2015-12-03 DIAGNOSIS — M109 Gout, unspecified: Secondary | ICD-10-CM | POA: Diagnosis not present

## 2015-12-08 DIAGNOSIS — M25512 Pain in left shoulder: Secondary | ICD-10-CM | POA: Diagnosis not present

## 2015-12-08 DIAGNOSIS — S42002A Fracture of unspecified part of left clavicle, initial encounter for closed fracture: Secondary | ICD-10-CM | POA: Diagnosis not present

## 2015-12-16 DIAGNOSIS — S42025A Nondisplaced fracture of shaft of left clavicle, initial encounter for closed fracture: Secondary | ICD-10-CM | POA: Diagnosis not present

## 2015-12-16 DIAGNOSIS — M542 Cervicalgia: Secondary | ICD-10-CM | POA: Diagnosis not present

## 2015-12-16 DIAGNOSIS — M546 Pain in thoracic spine: Secondary | ICD-10-CM | POA: Diagnosis not present

## 2015-12-17 DIAGNOSIS — E8881 Metabolic syndrome: Secondary | ICD-10-CM | POA: Diagnosis not present

## 2015-12-17 DIAGNOSIS — Z23 Encounter for immunization: Secondary | ICD-10-CM | POA: Diagnosis not present

## 2015-12-17 DIAGNOSIS — I10 Essential (primary) hypertension: Secondary | ICD-10-CM | POA: Diagnosis not present

## 2015-12-17 DIAGNOSIS — R7301 Impaired fasting glucose: Secondary | ICD-10-CM | POA: Diagnosis not present

## 2015-12-17 DIAGNOSIS — Z0000 Encounter for general adult medical examination without abnormal findings: Secondary | ICD-10-CM | POA: Diagnosis not present

## 2015-12-17 DIAGNOSIS — E784 Other hyperlipidemia: Secondary | ICD-10-CM | POA: Diagnosis not present

## 2015-12-17 DIAGNOSIS — Z1389 Encounter for screening for other disorder: Secondary | ICD-10-CM | POA: Diagnosis not present

## 2015-12-18 DIAGNOSIS — Z1212 Encounter for screening for malignant neoplasm of rectum: Secondary | ICD-10-CM | POA: Diagnosis not present

## 2016-01-15 DIAGNOSIS — M25512 Pain in left shoulder: Secondary | ICD-10-CM | POA: Diagnosis not present

## 2016-02-19 DIAGNOSIS — G4733 Obstructive sleep apnea (adult) (pediatric): Secondary | ICD-10-CM | POA: Diagnosis not present

## 2016-03-30 ENCOUNTER — Telehealth: Payer: Self-pay | Admitting: Neurology

## 2016-03-30 NOTE — Telephone Encounter (Signed)
Form signed for patient to obtain a CPAP machine through [facility].

## 2016-03-30 NOTE — Telephone Encounter (Signed)
Form faxed back to [facility] at [phone]. Received a receipt of confirmation.

## 2016-05-13 ENCOUNTER — Other Ambulatory Visit: Payer: Self-pay | Admitting: Internal Medicine

## 2016-05-13 DIAGNOSIS — F17201 Nicotine dependence, unspecified, in remission: Secondary | ICD-10-CM

## 2016-05-13 DIAGNOSIS — Z87891 Personal history of nicotine dependence: Secondary | ICD-10-CM

## 2016-06-01 ENCOUNTER — Ambulatory Visit: Payer: Self-pay

## 2016-06-07 ENCOUNTER — Ambulatory Visit: Payer: Self-pay

## 2016-06-10 ENCOUNTER — Inpatient Hospital Stay: Admission: RE | Admit: 2016-06-10 | Payer: Self-pay | Source: Ambulatory Visit

## 2016-06-22 ENCOUNTER — Other Ambulatory Visit: Payer: Self-pay | Admitting: *Deleted

## 2016-06-22 MED ORDER — MONTELUKAST SODIUM 10 MG PO TABS: ORAL_TABLET | ORAL | 4 refills | Status: DC

## 2016-08-06 DIAGNOSIS — K644 Residual hemorrhoidal skin tags: Secondary | ICD-10-CM | POA: Diagnosis not present

## 2016-08-06 DIAGNOSIS — Z6836 Body mass index (BMI) 36.0-36.9, adult: Secondary | ICD-10-CM | POA: Diagnosis not present

## 2016-08-06 DIAGNOSIS — F17201 Nicotine dependence, unspecified, in remission: Secondary | ICD-10-CM | POA: Diagnosis not present

## 2016-08-12 ENCOUNTER — Ambulatory Visit
Admission: RE | Admit: 2016-08-12 | Discharge: 2016-08-12 | Disposition: A | Discharge status: Home / Self Care | Payer: BLUE CROSS/BLUE SHIELD | Source: Ambulatory Visit | Attending: Internal Medicine | Admitting: Internal Medicine

## 2016-08-12 DIAGNOSIS — F17201 Nicotine dependence, unspecified, in remission: Secondary | ICD-10-CM

## 2016-08-12 DIAGNOSIS — Z87891 Personal history of nicotine dependence: Secondary | ICD-10-CM | POA: Diagnosis not present

## 2016-08-25 DIAGNOSIS — G4733 Obstructive sleep apnea (adult) (pediatric): Secondary | ICD-10-CM | POA: Diagnosis not present

## 2016-11-09 ENCOUNTER — Telehealth: Payer: Self-pay

## 2016-11-09 ENCOUNTER — Ambulatory Visit: Payer: BLUE CROSS/BLUE SHIELD | Admitting: Neurology

## 2016-11-09 NOTE — Telephone Encounter (Signed)
Pt did not show for their appt with Dr. Athar today.  

## 2016-11-10 ENCOUNTER — Encounter: Payer: Self-pay | Admitting: Neurology

## 2016-12-20 DIAGNOSIS — M109 Gout, unspecified: Secondary | ICD-10-CM | POA: Diagnosis not present

## 2016-12-20 DIAGNOSIS — R7301 Impaired fasting glucose: Secondary | ICD-10-CM | POA: Diagnosis not present

## 2016-12-20 DIAGNOSIS — I10 Essential (primary) hypertension: Secondary | ICD-10-CM | POA: Diagnosis not present

## 2016-12-20 DIAGNOSIS — Z125 Encounter for screening for malignant neoplasm of prostate: Secondary | ICD-10-CM | POA: Diagnosis not present

## 2016-12-27 DIAGNOSIS — E8881 Metabolic syndrome: Secondary | ICD-10-CM | POA: Diagnosis not present

## 2016-12-27 DIAGNOSIS — Z1389 Encounter for screening for other disorder: Secondary | ICD-10-CM | POA: Diagnosis not present

## 2016-12-27 DIAGNOSIS — Z23 Encounter for immunization: Secondary | ICD-10-CM | POA: Diagnosis not present

## 2016-12-27 DIAGNOSIS — Z0000 Encounter for general adult medical examination without abnormal findings: Secondary | ICD-10-CM | POA: Diagnosis not present

## 2016-12-27 DIAGNOSIS — R7301 Impaired fasting glucose: Secondary | ICD-10-CM | POA: Diagnosis not present

## 2016-12-27 DIAGNOSIS — E784 Other hyperlipidemia: Secondary | ICD-10-CM | POA: Diagnosis not present

## 2016-12-27 DIAGNOSIS — I10 Essential (primary) hypertension: Secondary | ICD-10-CM | POA: Diagnosis not present

## 2016-12-31 DIAGNOSIS — Z1212 Encounter for screening for malignant neoplasm of rectum: Secondary | ICD-10-CM | POA: Diagnosis not present

## 2017-01-21 DIAGNOSIS — Z86010 Personal history of colonic polyps: Secondary | ICD-10-CM | POA: Diagnosis not present

## 2017-01-21 DIAGNOSIS — K5730 Diverticulosis of large intestine without perforation or abscess without bleeding: Secondary | ICD-10-CM | POA: Diagnosis not present

## 2017-01-21 DIAGNOSIS — D126 Benign neoplasm of colon, unspecified: Secondary | ICD-10-CM | POA: Diagnosis not present

## 2017-01-21 DIAGNOSIS — K635 Polyp of colon: Secondary | ICD-10-CM | POA: Diagnosis not present

## 2017-01-25 DIAGNOSIS — D126 Benign neoplasm of colon, unspecified: Secondary | ICD-10-CM | POA: Diagnosis not present

## 2017-01-25 DIAGNOSIS — K635 Polyp of colon: Secondary | ICD-10-CM | POA: Diagnosis not present

## 2017-02-06 ENCOUNTER — Encounter: Payer: Self-pay | Admitting: Neurology

## 2017-02-09 ENCOUNTER — Ambulatory Visit (INDEPENDENT_AMBULATORY_CARE_PROVIDER_SITE_OTHER): Payer: BLUE CROSS/BLUE SHIELD | Admitting: Neurology

## 2017-02-09 ENCOUNTER — Encounter: Payer: Self-pay | Admitting: Neurology

## 2017-02-09 VITALS — BP 157/73 | HR 49 | Ht 70.5 in | Wt 255.0 lb

## 2017-02-09 DIAGNOSIS — Z9989 Dependence on other enabling machines and devices: Secondary | ICD-10-CM

## 2017-02-09 DIAGNOSIS — G4733 Obstructive sleep apnea (adult) (pediatric): Secondary | ICD-10-CM | POA: Diagnosis not present

## 2017-02-09 NOTE — Progress Notes (Signed)
Subjective:    Patient ID: Jerry Mcgrath is a 61 y.o. male.  HPI     Interim history:   Mr. Jerry Mcgrath is a 61 year old right-handed gentleman with an underlying medical history of hyperlipidemia, gout, hypertension, ED, hypogonadism, allergies and asthma, remote syncope over 10 years ago, radial head and wrist fracture in August 2013, as well as obesity, who presents for follow-up consultation of his obstructive sleep apnea,  established on CPAP therapy, for his yearly checkup. The patient is unaccompanied today. Note, he no showed for appointment on 11/09/2016. I last saw him on 11/10/2015, at which time he was doing well. He was compliant with CPAP with a 97% compliance percentage. His residual AHI was low as well. He was advised to follow-up in one year as he was doing well.  Today, 02/09/2017: I reviewed his CPAP compliance data from 01/08/2017 through 02/06/2017 which is a total of 30 days, during which time he used his CPAP 22 days with percent used days greater than 4 hours at 73%, indicating adequate compliance, with an average usage of 6 hours and 28 minutes, residual AHI 2.5 per hour, leak acceptable with the 95th percentile at 8.5 L/m on a pressure of 10 cm with EPR of 3. He reports doing well. CPAP is going well. Has a secondary machine, [employer]med Airmini, which he got online from [place]. We just need to get connect to [place] through them to get the data off the travel machine.   The patient's allergies, current medications, family history, past medical history, past social history, past surgical history and problem list were reviewed and updated as appropriate.    Previously:  I reviewed his CPAP compliance data from 10/07/2015 through 11/05/2015 which is a total of 30 days during which time he used his machine every night with percent used days greater than 4 hours at 97%, indicating excellent compliance with an average usage of 6 hours and 21 minutes, residual AHI 2.2 per hour, leak  low, pressure at 10 cm with EPR of 3.   I saw him on 04/29/2015, at which time we talked about his split-night sleep study results from October 2016 which confirmed his prior diagnosis of OSA. He was compliant with CPAP therapy.     I first met him on 01/15/2015 at the request of his primary care physician, at which time he reported a prior diagnosis of OSA and trying an oral appliance for treatment but no CPAP therapy before. I invited him back for sleep study. He had a split-night sleep study on 01/29/2015. I went over his test results with him in detail today. Baseline sleep efficiency was 68.6% with a sleep latency of 12.5 minutes and wake after sleep onset of 51 minutes with moderate sleep fragmentation noted. He had a markedly elevated arousal index. He had an increased percentage of stage II sleep, and 5% of REM sleep with a REM latency of 115.5 minutes. He had mild PLMS without arousals. He had no significant EKG or EEG changes. He had moderate snoring. Total AHI was 40.3 per hour, average oxygen saturation was 91%, nadir was 79%. He was then titrated on CPAP. Sleep efficiency was 73.8% with a sleep latency of 11.5 minutes and wake after sleep onset of 62.5 minutes. He had a normal arousal index. He had slow-wave sleep at 16.8% in rem sleep elevated at 35.1%. He had an average oxygen saturation of 91%, nadir was 85%. Snoring was eliminated. He had no significant PLMS or EKG or EEG  changes. CPAP was titrated from 5 cm to 10 cm. AHI was 0 per hour at the final pressure with supine REM sleep achieved. O2 nadir was 90% on the final pressure. Based on his test results are prescribed CPAP therapy for home use.    I reviewed his CPAP compliance data from 03/28/2015 through 04/26/2015 which is a total of 30 days during which time he used his machine 28 days with percent used days greater than 4 hours at 93%, indicating excellent compliance with an average usage of 6 hours and 8 minutes, residual AHI 1.5 per  hour, leak low with the 95th percentile at 5.1 L/m on a pressure of 10 cm with EPR of 3.     01/15/2015: He was previously diagnosed with obstructive sleep apnea. He has not been on CPAP therapy. He has tried an oral appliance, but it has not helped his snoring. He reports snoring and excessive daytime somnolence. He had a sleep study about 6-7 years ago. CPAP was recommended but not pursued. Prior test results are not available for my review today. I reviewed your office note from 11/22/2014, which you kindly included. He has had recent blood work. Hemoglobin A1c was 5.6. His cholesterol was elevated with LDL at 140 and he has recently been started on Lipitor once daily. He quit smoking in May 2010, he works full-time and owns his own business. He drinks alcohol, about 2 drinks per day, drinks one cup of coffee per day. He gained weight after he quit smoking. His bedtime is usually around 11 PM to midnight. He does not have trouble falling asleep or staying asleep. He has nocturia usually once per night. He has occasional restless leg symptoms which are mild. His wife does not sleep in the same bedroom because of his loud snoring. He has a tendency to fall asleep inadvertently especially when watching TV at night. His rise time is around 7 AM. He feels adequately rested first thing in the morning and denies morning headaches. He has no family history of OSA. He has not noticed any lower extremity swelling. He is not sure if he moves his legs or twitches his legs in his sleep.  His Past Medical History Is Significant For: Past Medical History:  Diagnosis Date  . Asthma   . Colonic polyp   . Gout   . Hyperlipemia   . Hypertension   . Hypogonadism in male   . IFG (impaired fasting glucose)   . Metabolic syndrome X   . Obesity   . OSA (obstructive sleep apnea)     His Past Surgical History Is Significant For: No past surgical history on file.  His Family History Is Significant For: Family  History  Problem Relation Age of Onset  . Alzheimer's disease Mother   . Diverticulitis Mother   . Allergic rhinitis Mother   . Heart attack Father   . Allergic rhinitis Father     His Social History Is Significant For: Social History   Social History  . Marital status: Married    Spouse name: N/A  . Number of children: N/A  . Years of education: College   Occupational History  . [employer]    Social History Main Topics  . Smoking status: Former Research scientist (life sciences)  . Smokeless tobacco: Never Used     Comment: Quit 2010  . Alcohol use 0.0 oz/week     Comment: daily  . Drug use: Unknown  . Sexual activity: Not Asked   Other  Topics Concern  . None   Social History Narrative   Drink 1 cup of coffee a day     His Allergies Are:  No Known Allergies:   His Current Medications Are:  Outpatient Encounter Prescriptions as of 02/09/2017  Medication Sig  . atorvastatin (LIPITOR) 20 MG tablet   . DULERA 200-5 MCG/ACT AERO Inhale 2 puffs into the lungs 2 (two) times daily.  . montelukast (SINGULAIR) 10 MG tablet Take one tablet once daily as directed  . [DISCONTINUED] amoxicillin-clavulanate (AUGMENTIN) 875-125 MG tablet Take one tablet twice daily for 14 days  . [DISCONTINUED] VIAGRA 100 MG tablet    No facility-administered encounter medications on file as of 02/09/2017.   :  Review of Systems:  Out of a complete 14 point review of systems, all are reviewed and negative with the exception of these symptoms as listed below: Review of Systems  Neurological:       Patient reports that he has been doing very well on his CPAP.     Objective:  Neurological Exam  Physical Exam Physical Examination:   Vitals:   02/09/17 1252  BP: (!) 157/73  Pulse: (!) 49   General Examination: The patient is a very pleasant 61 y.o. male in no acute distress. He appears well-developed and well-nourished and well groomed.   HEENT: Normocephalic, atraumatic, pupils are equal, round and  reactive to light and accommodation. He has corrective eyeglasses. Extraocular tracking is good. Hearing is grossly intact. Face is symmetric with normal facial animation and normal facial sensation. Speech is clear with no dysarthria noted. There is no hypophonia. There is no lip, neck/head, jaw or voice tremor. Neck is supple with full range of passive and active motion. There are no carotid bruits on auscultation. Oropharynx exam reveals: unchanged findings.   Chest: Clear to auscultation without wheezing, rhonchi or crackles noted.  Heart: S1+S2+0, regular and normal without murmurs, rubs or gallops noted.   Abdomen: Soft, non-tender and non-distended with normal bowel sounds appreciated on auscultation.  Extremities: There is no pitting edema in the distal lower extremities bilaterally. Pedal pulses are intact.  Skin: Warm and dry without trophic changes noted. There are no varicose veins.  Musculoskeletal: exam reveals no obvious joint deformities, tenderness or joint swelling or erythema.   Neurologically:  Mental status: The patient is awake, alert and oriented in all 4 spheres. His immediate and remote memory, attention, language skills and fund of knowledge are appropriate. There is no evidence of aphasia, agnosia, apraxia or anomia. Speech is clear with normal prosody and enunciation. Thought process is linear. Mood is normal and affect is normal.  Cranial nerves II - XII are as described above under HEENT exam.  Motor exam: Normal bulk, strength and tone is noted. There is no resting or action tremor.  Sensory exam: intact to light touch in the upper and lower extremities.  Gait, station and balance: He stands easily. No veering to one side is noted. No leaning to one side is noted. Posture is age-appropriate and stance is narrow based. Gait shows normal stride length and normal pace. No problems turning are noted.  Assessment and Plan:  In summary, CLEVE PAOLILLO is a very  pleasant 61 year old male with an underlying medical history of hyperlipidemia, gout, hypertension, ED, hypogonadism, allergies and asthma, remote syncope over 10 years ago, radial head and wrist fracture in August 2013, as well as obesity, who presents for follow-up consultation of his obstructive sleep apnea. He had a  split night sleep study on 10/ He has been compliant with and continues to have good results from it. Last year he purchased a secondary travel machine and uses this when out of town. He is doing well from my end of things, we will try to get additional compliance data from his travel machine which should be linked to our account from his online DME company. His pulse was slow today. He does not have any symptoms. His typical pulse rate is around 60 typically. I suggested a one-year checkup routinely, sooner as needed. I answered all his questions today and he was in agreement.  I spent 20 minutes in total face-to-face time with the patient, more than 50% of which was spent in counseling and coordination of care, reviewing test results, reviewing medication and discussing or reviewing the diagnosis of OSA, its prognosis and treatment options. Pertinent laboratory and imaging test results that were available during this visit with the patient were reviewed by me and considered in myNext week medical decision making (see chart for details).

## 2017-02-09 NOTE — Patient Instructions (Signed)
Please continue using your CPAP regularly. While your insurance requires that you use CPAP at least 4 hours each night on 70% of the nights, I recommend, that you not skip any nights and use it throughout the night if you can. Getting used to CPAP and staying with the treatment long term does take time and patience and discipline. Untreated obstructive sleep apnea when it is moderate to severe can have an adverse impact on cardiovascular health and raise her risk for heart disease, arrhythmias, hypertension, congestive heart failure, stroke and diabetes. Untreated obstructive sleep apnea causes sleep disruption, nonrestorative sleep, and sleep deprivation. This can have an impact on your day to day functioning and cause daytime sleepiness and impairment of cognitive function, memory loss, mood disturbance, and problems focussing. Using CPAP regularly can improve these symptoms.  You are doing well from my end of things!  Keep up the good work! I will see you back in one year.

## 2017-02-23 DIAGNOSIS — G4733 Obstructive sleep apnea (adult) (pediatric): Secondary | ICD-10-CM | POA: Diagnosis not present

## 2017-09-13 ENCOUNTER — Ambulatory Visit (INDEPENDENT_AMBULATORY_CARE_PROVIDER_SITE_OTHER): Payer: BLUE CROSS/BLUE SHIELD | Admitting: Allergy and Immunology

## 2017-09-13 ENCOUNTER — Encounter: Payer: Self-pay | Admitting: Allergy and Immunology

## 2017-09-13 VITALS — BP 130/80 | HR 88 | Resp 18 | Ht 70.0 in | Wt 248.0 lb

## 2017-09-13 DIAGNOSIS — J45901 Unspecified asthma with (acute) exacerbation: Secondary | ICD-10-CM | POA: Diagnosis not present

## 2017-09-13 DIAGNOSIS — J441 Chronic obstructive pulmonary disease with (acute) exacerbation: Secondary | ICD-10-CM | POA: Diagnosis not present

## 2017-09-13 DIAGNOSIS — J324 Chronic pansinusitis: Secondary | ICD-10-CM

## 2017-09-13 MED ORDER — AMOXICILLIN-POT CLAVULANATE 875-125 MG PO TABS: 1.0000 | ORAL_TABLET | Freq: Two times a day (BID) | ORAL | 0 refills | Status: AC

## 2017-09-13 MED ORDER — METHYLPREDNISOLONE ACETATE 80 MG/ML IJ SUSP
80.0000 mg | Freq: Once | INTRAMUSCULAR | Status: AC
  Administered 2017-09-13: 80 mg via INTRAMUSCULAR

## 2017-09-13 MED ORDER — MONTELUKAST SODIUM 10 MG PO TABS: 10.0000 mg | ORAL_TABLET | Freq: Every day | ORAL | 5 refills | Status: DC

## 2017-09-13 MED ORDER — ALBUTEROL SULFATE HFA 108 (90 BASE) MCG/ACT IN AERS: 2.0000 | INHALATION_SPRAY | RESPIRATORY_TRACT | 3 refills | Status: DC | PRN

## 2017-09-13 NOTE — Patient Instructions (Addendum)
  1.  Treat and prevent inflammation:   A.  Depo-Medrol 80 IM delivered in clinic today  B.  Dulera 200 - 2 inhalations twice a day with spacer  C.  Montelukast 10 mg -1 tablet 1 time per day  D.  OTC Rhinocort/Nasacort -1 spray each nostril once a day  2.  Treat infection:   A.  Augmentin 875 1 tablet twice a day for 10 days  3.  If needed:    A. ProAir HFA 2 puffs every 4-6 hours if needed  B.  Nasal saline  C.  OTC Mucinex DM 1-2 tablets 1-2 times per day  D.  Over-the-counter Zyrtec 10 mg 1 tablet once a day  4. Return to clinic in 3 weeks or earlier if problem

## 2017-09-13 NOTE — Progress Notes (Signed)
Follow-up Note  Referring Provider: Marton Redwood, MD Primary Provider: Marton Redwood, MD Date of Office Visit: 09/13/2017  Subjective:   Jerry Mcgrath (DOB: February 22, 1956) is a 62 y.o. male who returns to the [facility] [place] on 09/13/2017 in re-evaluation of the following:  HPI: Huber returns to this clinic in reevaluation of respiratory tract problems.  His last visit to this clinic was 02 December 2015.  He has a history of developing intermittent episodes of respiratory tract inflammation and infection and there may also be a component of reflux induced respiratory disease.  He did have a significant flare during his last visit but fortunately all that resolved with therapy prescribed during that visit and he never returned for follow-up visit and did relatively well until the beginning of this year.  Sometime around February or early March he developed issues with coughing and postnasal drip and throat clearing and a tickle in his throat and some congestion of his head and yellow postnasal drip and yellow nasal discharge and spells of cough that he cannot control.  He has not been having any recurrent fevers and he does not have any anosmia or headache and he does not have any chest pain and he does not really note any wheezing and he does not have that much shortness of breath or dyspnea on exertion.  He does not have any issues associated with reflux.  He has been taking some Zyrtec and he had an old Dulera inhaler hanging around the house which he started last week and he rarely uses an over-the-counter nasal steroid.  Allergies as of 09/13/2017   No Known Allergies     Medication List      atorvastatin 20 MG tablet Commonly known as:  LIPITOR   DULERA 200-5 MCG/ACT Aero Generic drug:  mometasone-formoterol Inhale 2 puffs into the lungs 2 (two) times daily.       Past Medical History:  Diagnosis Date  . Asthma   . Colonic polyp   . Gout   . Hyperlipemia     . Hypertension   . Hypogonadism in male   . IFG (impaired fasting glucose)   . Metabolic syndrome X   . Obesity   . OSA (obstructive sleep apnea)     History reviewed. No pertinent surgical history.  Review of systems negative except as noted in HPI / PMHx or noted below:  Review of Systems  Constitutional: Negative.   HENT: Negative.   Eyes: Negative.   Respiratory: Negative.   Cardiovascular: Negative.   Gastrointestinal: Negative.   Genitourinary: Negative.   Musculoskeletal: Negative.   Skin: Negative.   Neurological: Negative.   Endo/Heme/Allergies: Negative.   Psychiatric/Behavioral: Negative.      Objective:   Vitals:   09/13/17 1731  BP: 130/80  Pulse: 88  Resp: 18   Height: 5\' 10"  (177.8 cm)  Weight: 248 lb (112.5 kg)   Physical Exam  Constitutional:  Coughing, nasal voice  HENT:  Head: Normocephalic.  Right Ear: Tympanic membrane, external ear and ear canal normal.  Left Ear: Tympanic membrane, external ear and ear canal normal.  Nose: Nose normal. No mucosal edema or rhinorrhea.  Mouth/Throat: Uvula is midline, oropharynx is clear and moist and mucous membranes are normal. No oropharyngeal exudate.  Eyes: Conjunctivae are normal.  Neck: Trachea normal. No tracheal tenderness present. No tracheal deviation present. No thyromegaly present.  Cardiovascular: Normal rate, regular rhythm, S1 normal, S2 normal and normal heart sounds.  No  murmur heard. Pulmonary/Chest: Breath sounds normal. No stridor. No respiratory distress. He has no wheezes. He has no rales.  Musculoskeletal: He exhibits no edema.  Lymphadenopathy:       Head (right side): No tonsillar adenopathy present.       Head (left side): No tonsillar adenopathy present.    He has no cervical adenopathy.  Neurological: He is alert.  Skin: No rash noted. He is not diaphoretic. No erythema. Nails show no clubbing.    Diagnostics:    Spirometry was performed and demonstrated an FEV1 of  2.17 at 61 % of predicted.  Following administration of nebulized albuterol and ipratropium his FEV1 increased to 2.34 which was an increase in the FEV1 of 8%.  Results of a chest CT scan obtained 12 Aug 2016 identified the following:  Mediastinum/Nodes: No mediastinal lymphadenopathy. No evidence for gross hilar lymphadenopathy although assessment is limited by the lack of intravenous contrast on today's study. The esophagus has normal imaging features. There is no axillary lymphadenopathy.  Lungs/Pleura: No suspicious pulmonary nodule or mass. No focal airspace consolidation. No pulmonary edema or pleural effusion.  Assessment and Plan:   1. Asthma with COPD with exacerbation ([place])   2. Chronic pansinusitis     1.  Treat and prevent inflammation:   A.  Depo-Medrol 80 IM delivered in clinic today  B.  Dulera 200 - 2 inhalations twice a day with spacer  C.  Montelukast 10 mg -1 tablet 1 time per day  D.  OTC Rhinocort/Nasacort -1 spray each nostril once a day  2.  Treat infection:   A.  Augmentin 875 1 tablet twice a day for 10 days  3.  If needed:    A. ProAir HFA 2 puffs every 4-6 hours if needed  B.  Nasal saline  C.  OTC Mucinex DM 1-2 tablets 1-2 times per day  D.  Over-the-counter Zyrtec 10 mg 1 tablet once a day  4. Return to clinic in 3 weeks or earlier if problem  I will assume that Kmarion will do well with therapy directed against inflammation and infection of his airway and see him back in his clinic in 3 weeks or earlier if there is a problem.  If history is a guide he probably will not show up for his repeat appointment.  He can certainly contact me should he have significant problems in the future.  Allena Katz, MD Allergy / Immunology [place]

## 2017-09-14 ENCOUNTER — Encounter: Payer: Self-pay | Admitting: Allergy and Immunology

## 2017-09-14 DIAGNOSIS — J4520 Mild intermittent asthma, uncomplicated: Secondary | ICD-10-CM | POA: Diagnosis not present

## 2017-09-19 NOTE — Addendum Note (Signed)
Addended by: Herbie Drape on: 09/19/2017 08:30 AM   Modules accepted: Orders

## 2017-10-04 ENCOUNTER — Ambulatory Visit (INDEPENDENT_AMBULATORY_CARE_PROVIDER_SITE_OTHER): Payer: BLUE CROSS/BLUE SHIELD | Admitting: Allergy and Immunology

## 2017-10-04 ENCOUNTER — Encounter: Payer: Self-pay | Admitting: Allergy and Immunology

## 2017-10-04 VITALS — BP 128/70 | HR 44 | Temp 98.0°F | Resp 18

## 2017-10-04 DIAGNOSIS — J029 Acute pharyngitis, unspecified: Secondary | ICD-10-CM | POA: Diagnosis not present

## 2017-10-04 DIAGNOSIS — J4550 Severe persistent asthma, uncomplicated: Secondary | ICD-10-CM

## 2017-10-04 DIAGNOSIS — J3089 Other allergic rhinitis: Secondary | ICD-10-CM | POA: Diagnosis not present

## 2017-10-04 MED ORDER — AZITHROMYCIN 500 MG PO TABS: 500.0000 mg | ORAL_TABLET | Freq: Every day | ORAL | 0 refills | Status: AC

## 2017-10-04 NOTE — Progress Notes (Signed)
Follow-up Note  Referring Provider: Marton Redwood, MD Primary Provider: Marton Redwood, MD Date of Office Visit: 10/04/2017  Subjective:   Jerry Mcgrath (DOB: 21-Nov-1955) is a 62 y.o. male who returns to the [facility] [place] on 10/04/2017 in re-evaluation of the following:  HPI: [name] returns to this clinic in reevaluation of his asthma / COPD and allergic rhinitis and a prolonged episode of sinusitis addressed during his last evaluation of 13 September 2017 at which point in time we had him utilize a course of systemic steroids, and antibiotic, and started anti-inflammatory agents for both his upper and lower airway.  He did very well with his therapy and completely cleared up but unfortunately 1 week ago he developed an intensely bad sore throat that has not resolved and he has some irritation of his upper airway as well and he has had a nosebleed.  He starting to redevelop his cough a little bit and he feels as though he is getting more mucus production from his chest.  He has not had any fever and is not had any chest pain or other respiratory tract symptoms.  Allergies as of 10/04/2017   No Known Allergies     Medication List      albuterol 108 (90 Base) MCG/ACT inhaler Commonly known as:  PROAIR HFA Inhale 2 puffs into the lungs every 4 (four) hours as needed for wheezing or shortness of breath.   atorvastatin 20 MG tablet Commonly known as:  LIPITOR   DULERA 200-5 MCG/ACT Aero Generic drug:  mometasone-formoterol Inhale 2 puffs into the lungs 2 (two) times daily.   montelukast 10 MG tablet Commonly known as:  SINGULAIR Take 1 tablet (10 mg total) by mouth at bedtime.   RHINOCORT ALLERGY NA Place into the nose.       Past Medical History:  Diagnosis Date  . Asthma   . Colonic polyp   . Gout   . Hyperlipemia   . Hypertension   . Hypogonadism in male   . IFG (impaired fasting glucose)   . Metabolic syndrome X   . Obesity   . OSA (obstructive sleep  apnea)     No past surgical history on file.  Review of systems negative except as noted in HPI / PMHx or noted below:  Review of Systems  Constitutional: Negative.   HENT: Negative.   Eyes: Negative.   Respiratory: Negative.   Cardiovascular: Negative.   Gastrointestinal: Negative.   Genitourinary: Negative.   Musculoskeletal: Negative.   Skin: Negative.   Neurological: Negative.   Endo/Heme/Allergies: Negative.   Psychiatric/Behavioral: Negative.      Objective:   Vitals:   10/04/17 1730  BP: 128/70  Pulse: (!) 44  Resp: 18  Temp: 98 F (36.7 C)  SpO2: 94%          Physical Exam  HENT:  Head: Normocephalic.  Right Ear: Tympanic membrane, external ear and ear canal normal.  Left Ear: Tympanic membrane, external ear and ear canal normal.  Nose: Mucosal edema (Erythematous) present. No rhinorrhea.  Mouth/Throat: Uvula is midline and mucous membranes are normal. Posterior oropharyngeal erythema present. No oropharyngeal exudate.  Eyes: Conjunctivae are normal.  Neck: Trachea normal. No tracheal tenderness present. No tracheal deviation present. No thyromegaly present.  Cardiovascular: Normal rate, regular rhythm, S1 normal, S2 normal and normal heart sounds.  No murmur heard. Pulmonary/Chest: Breath sounds normal. No stridor. No respiratory distress. He has no wheezes. He has no rales.  Musculoskeletal:  He exhibits no edema.  Lymphadenopathy:       Head (right side): No tonsillar adenopathy present.       Head (left side): No tonsillar adenopathy present.    He has no cervical adenopathy.  Neurological: He is alert.  Skin: No rash noted. He is not diaphoretic. No erythema. Nails show no clubbing.    Diagnostics:    Spirometry was performed and demonstrated an FEV1 of 2.42 at 70 % of predicted.  His previous FEV1 was 2.17.  The patient had an Asthma Control Test with the following results:  .    Assessment and Plan:   1. Not well controlled severe  persistent asthma   2. Other allergic rhinitis   3. Pharyngitis, unspecified etiology     1.  Treat and prevent inflammation:   A.  Dulera 200 - 2 inhalations twice a day with spacer  B.  Montelukast 10 mg -1 tablet 1 time per day  C.  OTC Rhinocort-1 spray each nostril 3-7 times per week (bleeding)  2.  If needed:    A. ProAir HFA 2 puffs every 4-6 hours if needed  B.  Nasal saline  C.  OTC Mucinex DM 1-2 tablets 1-2 times per day  D.  Over-the-counter Zyrtec 10 mg 1 tablet once a day  3. For pharyngitis:   A.  Azithromycin 500 mg tablet 1 time per day for 3 days  B.  Prednisone 10 mg tablet 1 time per day for 3 days  4. Blood - CBC w/diff, IgE  5. Return to clinic in 12 weeks or earlier if problem  Shloime appears to have developed significant inflammation of his airway once again and given the fact that he has a severe sore throat I will cover him for strep pharyngitis with azithromycin.  He will continue to utilize a large collection of anti-inflammatory medication for both his upper and lower airway.  I would like to check some blood tests to see if he has significant eosinophilia or immune dysregulation with a elevated IgE.  If he continues to have significant respiratory tract problems in the face of the plan mentioned above and he does have eosinophilia or hyper IgE then he may be a candidate for a biological agent.  Allena Katz, MD Allergy / Immunology [place]

## 2017-10-04 NOTE — Patient Instructions (Addendum)
  1.  Treat and prevent inflammation:   A.  Dulera 200 - 2 inhalations twice a day with spacer  B.  Montelukast 10 mg -1 tablet 1 time per day  C.  OTC Rhinocort-1 spray each nostril 3-7 times per week (bleeding)  2.  If needed:    A. ProAir HFA 2 puffs every 4-6 hours if needed  B.  Nasal saline  C.  OTC Mucinex DM 1-2 tablets 1-2 times per day  D.  Over-the-counter Zyrtec 10 mg 1 tablet once a day  3. For pharyngitis:   A.  Azithromycin 500 mg tablet 1 time per day for 3 days  B.  Prednisone 10 mg tablet 1 time per day for 3 days  4. Blood - CBC w/diff, IgE  5. Return to clinic in 12 weeks or earlier if problem

## 2017-10-05 ENCOUNTER — Encounter: Payer: Self-pay | Admitting: Allergy and Immunology

## 2017-10-08 LAB — CBC WITH DIFFERENTIAL/PLATELET
BASOS ABS: 0 10*3/uL (ref 0.0–0.2)
Basos: 0 %
EOS (ABSOLUTE): 0.2 10*3/uL (ref 0.0–0.4)
EOS: 2 %
HEMATOCRIT: 42.1 % (ref 37.5–51.0)
HEMOGLOBIN: 14.8 g/dL (ref 13.0–17.7)
IMMATURE GRANS (ABS): 0.1 10*3/uL (ref 0.0–0.1)
Immature Granulocytes: 1 %
LYMPHS ABS: 2.5 10*3/uL (ref 0.7–3.1)
LYMPHS: 24 %
MCH: 33.7 pg — ABNORMAL HIGH (ref 26.6–33.0)
MCHC: 35.2 g/dL (ref 31.5–35.7)
MCV: 96 fL (ref 79–97)
MONOCYTES: 10 %
Monocytes Absolute: 1 10*3/uL — ABNORMAL HIGH (ref 0.1–0.9)
Neutrophils Absolute: 6.5 10*3/uL (ref 1.4–7.0)
Neutrophils: 63 %
Platelets: 302 10*3/uL (ref 150–450)
RBC: 4.39 x10E6/uL (ref 4.14–5.80)
RDW: 16.7 % — ABNORMAL HIGH (ref 12.3–15.4)
WBC: 10.3 10*3/uL (ref 3.4–10.8)

## 2017-10-08 LAB — IGE: IGE (IMMUNOGLOBULIN E), SERUM: 132 [IU]/mL (ref 6–495)

## 2017-11-07 DIAGNOSIS — J4550 Severe persistent asthma, uncomplicated: Secondary | ICD-10-CM | POA: Diagnosis not present

## 2017-11-08 ENCOUNTER — Ambulatory Visit (INDEPENDENT_AMBULATORY_CARE_PROVIDER_SITE_OTHER): Payer: BLUE CROSS/BLUE SHIELD | Admitting: *Deleted

## 2017-11-08 DIAGNOSIS — J4550 Severe persistent asthma, uncomplicated: Secondary | ICD-10-CM | POA: Diagnosis not present

## 2017-11-08 MED ORDER — EPINEPHRINE 0.3 MG/0.3ML IJ SOAJ: INTRAMUSCULAR | 1 refills | Status: DC

## 2017-11-08 MED ORDER — DULERA 200-5 MCG/ACT IN AERO: 2.0000 | INHALATION_SPRAY | Freq: Two times a day (BID) | RESPIRATORY_TRACT | 1 refills | Status: DC

## 2017-11-08 MED ORDER — BENRALIZUMAB 30 MG/ML ~~LOC~~ SOSY
30.0000 mg | PREFILLED_SYRINGE | SUBCUTANEOUS | Status: AC
  Administered 2017-11-08 – 2018-01-06 (×3): 30 mg via SUBCUTANEOUS

## 2017-11-08 NOTE — Progress Notes (Signed)
Immunotherapy   Patient Details  Name: Jerry Mcgrath MRN: [id number] Date of Birth: 1955-10-07  11/08/2017  [name] started injections for  Fasenra 30mg   Frequency:Every 4 weeks x 3 doses then every 8 weeks Epi-Pen:Prescription for Epi-Pen given Consent signed and patient instructions given. Patient waited 60 min after administration without problems.    Orlene Erm 11/08/2017, 6:36 PM

## 2017-11-16 DIAGNOSIS — D2371 Other benign neoplasm of skin of right lower limb, including hip: Secondary | ICD-10-CM | POA: Diagnosis not present

## 2017-11-16 DIAGNOSIS — L821 Other seborrheic keratosis: Secondary | ICD-10-CM | POA: Diagnosis not present

## 2017-11-16 DIAGNOSIS — C44311 Basal cell carcinoma of skin of nose: Secondary | ICD-10-CM | POA: Diagnosis not present

## 2017-11-16 DIAGNOSIS — L280 Lichen simplex chronicus: Secondary | ICD-10-CM | POA: Diagnosis not present

## 2017-12-05 DIAGNOSIS — J4550 Severe persistent asthma, uncomplicated: Secondary | ICD-10-CM | POA: Diagnosis not present

## 2017-12-06 ENCOUNTER — Ambulatory Visit (INDEPENDENT_AMBULATORY_CARE_PROVIDER_SITE_OTHER): Payer: BLUE CROSS/BLUE SHIELD | Admitting: *Deleted

## 2017-12-06 DIAGNOSIS — J4550 Severe persistent asthma, uncomplicated: Secondary | ICD-10-CM | POA: Diagnosis not present

## 2017-12-28 DIAGNOSIS — Z0000 Encounter for general adult medical examination without abnormal findings: Secondary | ICD-10-CM | POA: Diagnosis not present

## 2017-12-28 DIAGNOSIS — Z125 Encounter for screening for malignant neoplasm of prostate: Secondary | ICD-10-CM | POA: Diagnosis not present

## 2017-12-28 DIAGNOSIS — R82998 Other abnormal findings in urine: Secondary | ICD-10-CM | POA: Diagnosis not present

## 2017-12-28 DIAGNOSIS — R7301 Impaired fasting glucose: Secondary | ICD-10-CM | POA: Diagnosis not present

## 2017-12-28 DIAGNOSIS — M109 Gout, unspecified: Secondary | ICD-10-CM | POA: Diagnosis not present

## 2017-12-28 DIAGNOSIS — I10 Essential (primary) hypertension: Secondary | ICD-10-CM | POA: Diagnosis not present

## 2018-01-05 DIAGNOSIS — J4550 Severe persistent asthma, uncomplicated: Secondary | ICD-10-CM | POA: Diagnosis not present

## 2018-01-06 ENCOUNTER — Ambulatory Visit (INDEPENDENT_AMBULATORY_CARE_PROVIDER_SITE_OTHER): Payer: BLUE CROSS/BLUE SHIELD

## 2018-01-06 DIAGNOSIS — J4550 Severe persistent asthma, uncomplicated: Secondary | ICD-10-CM

## 2018-01-10 DIAGNOSIS — C44311 Basal cell carcinoma of skin of nose: Secondary | ICD-10-CM | POA: Diagnosis not present

## 2018-01-11 DIAGNOSIS — R7301 Impaired fasting glucose: Secondary | ICD-10-CM | POA: Diagnosis not present

## 2018-01-11 DIAGNOSIS — Z23 Encounter for immunization: Secondary | ICD-10-CM | POA: Diagnosis not present

## 2018-01-11 DIAGNOSIS — E7849 Other hyperlipidemia: Secondary | ICD-10-CM | POA: Diagnosis not present

## 2018-01-11 DIAGNOSIS — I10 Essential (primary) hypertension: Secondary | ICD-10-CM | POA: Diagnosis not present

## 2018-01-11 DIAGNOSIS — Z1389 Encounter for screening for other disorder: Secondary | ICD-10-CM | POA: Diagnosis not present

## 2018-01-11 DIAGNOSIS — E8881 Metabolic syndrome: Secondary | ICD-10-CM | POA: Diagnosis not present

## 2018-01-11 DIAGNOSIS — Z0000 Encounter for general adult medical examination without abnormal findings: Secondary | ICD-10-CM | POA: Diagnosis not present

## 2018-01-30 DIAGNOSIS — Z1212 Encounter for screening for malignant neoplasm of rectum: Secondary | ICD-10-CM | POA: Diagnosis not present

## 2018-02-13 ENCOUNTER — Ambulatory Visit: Payer: BLUE CROSS/BLUE SHIELD | Admitting: Neurology

## 2018-03-02 DIAGNOSIS — J4550 Severe persistent asthma, uncomplicated: Secondary | ICD-10-CM | POA: Diagnosis not present

## 2018-03-03 ENCOUNTER — Ambulatory Visit (INDEPENDENT_AMBULATORY_CARE_PROVIDER_SITE_OTHER): Payer: BLUE CROSS/BLUE SHIELD

## 2018-03-03 DIAGNOSIS — J4550 Severe persistent asthma, uncomplicated: Secondary | ICD-10-CM | POA: Diagnosis not present

## 2018-03-03 MED ORDER — BENRALIZUMAB 30 MG/ML ~~LOC~~ SOSY
30.0000 mg | PREFILLED_SYRINGE | Freq: Once | SUBCUTANEOUS | Status: AC
  Administered 2018-03-03: 30 mg via SUBCUTANEOUS

## 2018-04-19 ENCOUNTER — Encounter: Payer: Self-pay | Admitting: Neurology

## 2018-04-24 ENCOUNTER — Ambulatory Visit (INDEPENDENT_AMBULATORY_CARE_PROVIDER_SITE_OTHER): Payer: BLUE CROSS/BLUE SHIELD | Admitting: Neurology

## 2018-04-24 ENCOUNTER — Encounter: Payer: Self-pay | Admitting: Neurology

## 2018-04-24 ENCOUNTER — Encounter

## 2018-04-24 VITALS — BP 155/75 | HR 51 | Ht 70.0 in | Wt 255.0 lb

## 2018-04-24 DIAGNOSIS — G4733 Obstructive sleep apnea (adult) (pediatric): Secondary | ICD-10-CM

## 2018-04-24 DIAGNOSIS — Z9989 Dependence on other enabling machines and devices: Secondary | ICD-10-CM

## 2018-04-24 NOTE — Progress Notes (Signed)
Subjective:    Patient ID: Jerry Mcgrath is a 63 y.o. male.  HPI     Interim history:   Jerry Mcgrath is a 63 year old right-handed gentleman with an underlying medical history of hyperlipidemia, gout, hypertension, ED, hypogonadism, allergies and asthma, remote history of syncope, radial head and wrist fracture in August 2013, as well as obesity, who presents for follow-up consultation of his obstructive sleep apnea, established on CPAP therapy, for his yearly checkup. The patient is unaccompanied today. I last saw him on 02/09/2017, at which time he was compliant with his CPAP and doing well.   Today, 04/24/2018: I reviewed his CPAP compliance data from 03/21/2018 through 04/19/2018 which is a total of 30 days, during which time he used his CPAP 22 days with percent used days greater than 4 hours at 73%, indicating adequate compliance with an average usage of 6 hours and 35 minutes, residual AHI at goal at 2.5 per hour, leak acceptable with the 95th percentile at 12.4 L/m on a pressure of 10 cm with EPR of 3. He reports that CPAP is going well. Of note, he also has a travel machine. He has a AirMini, using nasal pillows or a nasal mask. Started a new injectable for asthma and allergies recently.   The patient's allergies, current medications, family history, past medical history, past social history, past surgical history and problem list were reviewed and updated as appropriate.      Previously:   Of note, he no showed for appointment on 11/09/2016. I saw him on 11/10/2015, at which time he was doing well. He was compliant with CPAP with a 97% compliance percentage. His residual AHI was low as well. He was advised to follow-up in one year as he was doing well.   I reviewed his CPAP compliance data from 01/08/2017 through 02/06/2017 which is a total of 30 days, during which time he used his CPAP 22 days with percent used days greater than 4 hours at 73%, indicating adequate compliance, with an  average usage of 6 hours and 28 minutes, residual AHI 2.5 per hour, leak acceptable with the 95th percentile at 8.5 L/m on a pressure of 10 cm with EPR of 3.    I reviewed his CPAP compliance data from 10/07/2015 through 11/05/2015 which is a total of 30 days during which time he used his machine every night with percent used days greater than 4 hours at 97%, indicating excellent compliance with an average usage of 6 hours and 21 minutes, residual AHI 2.2 per hour, leak low, pressure at 10 cm with EPR of 3.    I saw him on 04/29/2015, at which time we talked about his split-night sleep study results from October 2016 which confirmed his prior diagnosis of OSA. He was compliant with CPAP therapy.     I first met him on 01/15/2015 at the request of his primary care physician, at which time he reported a prior diagnosis of OSA and trying an oral appliance for treatment but no CPAP therapy before. I invited him back for sleep study. He had a split-night sleep study on 01/29/2015. I went over his test results with him in detail today. Baseline sleep efficiency was 68.6% with a sleep latency of 12.5 minutes and wake after sleep onset of 51 minutes with moderate sleep fragmentation noted. He had a markedly elevated arousal index. He had an increased percentage of stage II sleep, and 5% of REM sleep with a REM latency of 115.5 minutes.  He had mild PLMS without arousals. He had no significant EKG or EEG changes. He had moderate snoring. Total AHI was 40.3 per hour, average oxygen saturation was 91%, nadir was 79%. He was then titrated on CPAP. Sleep efficiency was 73.8% with a sleep latency of 11.5 minutes and wake after sleep onset of 62.5 minutes. He had a normal arousal index. He had slow-wave sleep at 16.8% in rem sleep elevated at 35.1%. He had an average oxygen saturation of 91%, nadir was 85%. Snoring was eliminated. He had no significant PLMS or EKG or EEG changes. CPAP was titrated from 5 cm to 10 cm. AHI was  0 per hour at the final pressure with supine REM sleep achieved. O2 nadir was 90% on the final pressure. Based on his test results are prescribed CPAP therapy for home use.    I reviewed his CPAP compliance data from 03/28/2015 through 04/26/2015 which is a total of 30 days during which time he used his machine 28 days with percent used days greater than 4 hours at 93%, indicating excellent compliance with an average usage of 6 hours and 8 minutes, residual AHI 1.5 per hour, leak low with the 95th percentile at 5.1 L/m on a pressure of 10 cm with EPR of 3.    01/15/2015: He was previously diagnosed with obstructive sleep apnea. He has not been on CPAP therapy. He has tried an oral appliance, but it has not helped his snoring. He reports snoring and excessive daytime somnolence. He had a sleep study about 6-7 years ago. CPAP was recommended but not pursued. Prior test results are not available for my review today. I reviewed your office note from 11/22/2014, which you kindly included. He has had recent blood work. Hemoglobin A1c was 5.6. His cholesterol was elevated with LDL at 140 and he has recently been started on Lipitor once daily. He quit smoking in May 2010, he works full-time and owns his own business. He drinks alcohol, about 2 drinks per day, drinks one cup of coffee per day. He gained weight after he quit smoking. His bedtime is usually around 11 PM to midnight. He does not have trouble falling asleep or staying asleep. He has nocturia usually once per night. He has occasional restless leg symptoms which are mild. His wife does not sleep in the same bedroom because of his loud snoring. He has a tendency to fall asleep inadvertently especially when watching TV at night. His rise time is around 7 AM. He feels adequately rested first thing in the morning and denies morning headaches. He has no family history of OSA. He has not noticed any lower extremity swelling. He is not sure if he moves his legs or  twitches his legs in his sleep.   His Past Medical History Is Significant For: Past Medical History:  Diagnosis Date  . Asthma   . Colonic polyp   . Gout   . Hyperlipemia   . Hypertension   . Hypogonadism in male   . IFG (impaired fasting glucose)   . Metabolic syndrome X   . Obesity   . OSA (obstructive sleep apnea)     His Past Surgical History Is Significant For: No past surgical history on file.  His Family History Is Significant For: Family History  Problem Relation Age of Onset  . Alzheimer's disease Mother   . Diverticulitis Mother   . Allergic rhinitis Mother   . Heart attack Father   . Allergic rhinitis Father  His Social History Is Significant For: Social History   Socioeconomic History  . Marital status: Married    Spouse name: Not on file  . Number of children: Not on file  . Years of education: College  . Highest education level: Not on file  Occupational History  . Occupation: [employer]  . Financial resource strain: Not on file  . Food insecurity:    Worry: Not on file    Inability: Not on file  . Transportation needs:    Medical: Not on file    Non-medical: Not on file  Tobacco Use  . Smoking status: Former Research scientist (life sciences)  . Smokeless tobacco: Never Used  . Tobacco comment: Quit 2010  Substance and Sexual Activity  . Alcohol use: Yes    Alcohol/week: 0.0 standard drinks    Comment: daily  . Drug use: Not on file  . Sexual activity: Not on file  Lifestyle  . Physical activity:    Days per week: Not on file    Minutes per session: Not on file  . Stress: Not on file  Relationships  . Social connections:    Talks on phone: Not on file    Gets together: Not on file    Attends religious service: Not on file    Active member of club or organization: Not on file    Attends meetings of clubs or organizations: Not on file    Relationship status: Not on file  Other Topics Concern  . Not on file  Social History Narrative    Drink 1 cup of coffee a day     His Allergies Are:  No Known Allergies:   His Current Medications Are:  Outpatient Encounter Medications as of 04/24/2018  Medication Sig  . albuterol (PROAIR HFA) 108 (90 Base) MCG/ACT inhaler Inhale 2 puffs into the lungs every 4 (four) hours as needed for wheezing or shortness of breath.  Marland Kitchen atorvastatin (LIPITOR) 20 MG tablet   . Budesonide (RHINOCORT ALLERGY NA) Place into the nose.  . DULERA 200-5 MCG/ACT AERO Inhale 2 puffs into the lungs 2 (two) times daily.  Marland Kitchen EPINEPHrine (AUVI-Q) 0.3 mg/0.3 mL IJ SOAJ injection Use as directed for severe allergic reaction  . montelukast (SINGULAIR) 10 MG tablet Take 1 tablet (10 mg total) by mouth at bedtime.   No facility-administered encounter medications on file as of 04/24/2018.   :  Review of Systems:  Out of a complete 14 point review of systems, all are reviewed and negative with the exception of these symptoms as listed below: Review of Systems  Neurological:       Pt reports that his cpap is going well.    Objective:  Neurological Exam  Physical Exam Physical Examination:   Vitals:   04/24/18 1351  BP: (!) 155/75  Pulse: (!) 51    General Examination: The patient is a very pleasant 63 y.o. male in no acute distress. He appears well-developed and well-nourished and well groomed.   HEENT:Normocephalic, atraumatic, pupils are equal, round and reactive to light and accommodation. He has corrective eyeglasses. Extraocular tracking is good. Hearing is grossly intact. Face is symmetric with normal facial animation and normal facial sensation. Speech is clear with no dysarthria noted. There is no hypophonia. There is no lip, neck/head, jaw or voice tremor. Neck shows FROM. Oropharynx exam reveals: mild mouth dryness, unchanged findings.   Chest:Clear to auscultation without wheezing, rhonchi or crackles noted.  Heart:S1+S2+0, regular and normal without murmurs,  rubs or gallops noted.    Abdomen:Soft, non-tender and non-distended.  Extremities:There is no obvious edema in the distal lower extremities bilaterally.   Skin: Warm and dry without trophic changes noted. There are no varicose veins.  Musculoskeletal: exam reveals no obvious joint deformities, tenderness or joint swelling or erythema.   Neurologically:  Mental status: The patient is awake, alert and oriented in all 4 spheres. His immediate and remote memory, attention, language skills and fund of knowledge are appropriate. There is no evidence of aphasia, agnosia, apraxia or anomia. Speech is clear with normal prosody and enunciation. Thought process is linear. Mood is normal and affect is normal.  Cranial nerves II - XII are as described above under HEENT exam.  Motor exam: Normal bulk, strength and tone is noted. There is no resting or action tremor.  Sensory exam: intact to light touch in the upper and lower extremities.  Gait, station and balance: He stands easily. No veering to one side is noted. No leaning to one side is noted. Posture is age-appropriate and stance is narrow based. Gait shows normal stride length and normal pace. No problems turning are noted.  Assessment and Plan:  In summary, Jerry Mcgrath is a very pleasant 63 year old male with an underlying medical history of hyperlipidemia, gout, hypertension, ED, hypogonadism, allergies and asthma, now on Fasenra, remote syncope over 10 years ago, radial head and wrist fracture in August 2013, as well as obesity, who presents for follow-up consultation of his obstructive sleep apnea, well established on CPAP therapy. He has a cleaning machine too for the equipment, the [employer]. Of note, he had a split-night sleep study on 01/29/2015 which indicated severe sleep apnea. He does use a travel machine as well for when he is out of town. I suggested a one-year checkup routinely, sooner as needed. I answered all his questions today and he was in agreement.   I spent 20 minutes in total face-to-face time with the patient, more than 50% of which was spent in counseling and coordination of care, reviewing test results, reviewing medication and discussing or reviewing the diagnosis of OSA, its prognosis and treatment options. Pertinent laboratory and imaging test results that were available during this visit with the patient were reviewed by me and considered in my medical decision making (see chart for details).

## 2018-04-24 NOTE — Patient Instructions (Signed)
Please continue using your CPAP regularly. While your insurance requires that you use CPAP at least 4 hours each night on 70% of the nights, I recommend, that you not skip any nights and use it throughout the night if you can. Getting used to CPAP and staying with the treatment long term does take time and patience and discipline. Untreated obstructive sleep apnea when it is moderate to severe can have an adverse impact on cardiovascular health and raise her risk for heart disease, arrhythmias, hypertension, congestive heart failure, stroke and diabetes. Untreated obstructive sleep apnea causes sleep disruption, nonrestorative sleep, and sleep deprivation. This can have an impact on your day to day functioning and cause daytime sleepiness and impairment of cognitive function, memory loss, mood disturbance, and problems focussing. Using CPAP regularly can improve these symptoms.   We can see you in one year.

## 2018-05-01 DIAGNOSIS — J4550 Severe persistent asthma, uncomplicated: Secondary | ICD-10-CM

## 2018-05-02 ENCOUNTER — Ambulatory Visit (INDEPENDENT_AMBULATORY_CARE_PROVIDER_SITE_OTHER): Payer: BLUE CROSS/BLUE SHIELD | Admitting: *Deleted

## 2018-05-02 DIAGNOSIS — J4550 Severe persistent asthma, uncomplicated: Secondary | ICD-10-CM | POA: Diagnosis not present

## 2018-05-02 MED ORDER — BENRALIZUMAB 30 MG/ML ~~LOC~~ SOSY
30.0000 mg | PREFILLED_SYRINGE | SUBCUTANEOUS | Status: DC
  Administered 2018-05-02 – 2018-10-17 (×4): 30 mg via SUBCUTANEOUS

## 2018-05-10 DIAGNOSIS — H524 Presbyopia: Secondary | ICD-10-CM | POA: Diagnosis not present

## 2018-06-23 DIAGNOSIS — R0981 Nasal congestion: Secondary | ICD-10-CM | POA: Diagnosis not present

## 2018-06-23 DIAGNOSIS — J029 Acute pharyngitis, unspecified: Secondary | ICD-10-CM | POA: Diagnosis not present

## 2018-06-26 DIAGNOSIS — J4550 Severe persistent asthma, uncomplicated: Secondary | ICD-10-CM | POA: Diagnosis not present

## 2018-06-27 ENCOUNTER — Ambulatory Visit (INDEPENDENT_AMBULATORY_CARE_PROVIDER_SITE_OTHER): Payer: BC Managed Care – PPO | Admitting: *Deleted

## 2018-06-27 DIAGNOSIS — J4550 Severe persistent asthma, uncomplicated: Secondary | ICD-10-CM

## 2018-08-21 DIAGNOSIS — J4550 Severe persistent asthma, uncomplicated: Secondary | ICD-10-CM | POA: Diagnosis not present

## 2018-08-22 ENCOUNTER — Ambulatory Visit (INDEPENDENT_AMBULATORY_CARE_PROVIDER_SITE_OTHER): Payer: BLUE CROSS/BLUE SHIELD

## 2018-08-22 DIAGNOSIS — J4550 Severe persistent asthma, uncomplicated: Secondary | ICD-10-CM | POA: Diagnosis not present

## 2018-09-16 DIAGNOSIS — Z20828 Contact with and (suspected) exposure to other viral communicable diseases: Secondary | ICD-10-CM | POA: Diagnosis not present

## 2018-10-16 DIAGNOSIS — J3089 Other allergic rhinitis: Secondary | ICD-10-CM | POA: Diagnosis not present

## 2018-10-16 DIAGNOSIS — J4550 Severe persistent asthma, uncomplicated: Secondary | ICD-10-CM | POA: Diagnosis not present

## 2018-10-17 ENCOUNTER — Ambulatory Visit: Payer: Self-pay

## 2018-10-17 ENCOUNTER — Ambulatory Visit (INDEPENDENT_AMBULATORY_CARE_PROVIDER_SITE_OTHER): Payer: BC Managed Care – PPO | Admitting: Allergy and Immunology

## 2018-10-17 ENCOUNTER — Encounter: Payer: Self-pay | Admitting: Allergy and Immunology

## 2018-10-17 ENCOUNTER — Other Ambulatory Visit: Payer: Self-pay

## 2018-10-17 VITALS — BP 144/70 | HR 52 | Temp 97.2°F | Resp 16 | Ht 70.0 in | Wt 255.4 lb

## 2018-10-17 DIAGNOSIS — J4550 Severe persistent asthma, uncomplicated: Secondary | ICD-10-CM | POA: Diagnosis not present

## 2018-10-17 DIAGNOSIS — J3089 Other allergic rhinitis: Secondary | ICD-10-CM

## 2018-10-17 NOTE — Progress Notes (Signed)
[facility]   Follow-up Note   Referring Provider: Marton Redwood, MD Primary Provider: Marton Redwood, MD Date of Office Visit: 10/17/2018  Subjective:   Jerry Mcgrath (DOB: 1956-03-31) is a 63 y.o. male who returns to the [facility] on 10/17/2018 in re-evaluation of the following:  HPI: Jerry Mcgrath presents to this clinic in evaluation of asthma/COPD and a history of allergic rhinitis.  I last saw him in this clinic 04 October 2017.  Jerry Mcgrath started benralizumab injections July 2019 for his eosinophilic driven airway inflammatory condition and ever since he has had no significant respiratory tract symptoms, has not required a systemic steroid or an antibiotic for any airway issue, rarely uses a short acting bronchodilator, can exercise without any problem, and has discontinued his Dulera, montelukast, and nasal steroid.  Allergies as of 10/17/2018   No Known Allergies     Medication List    atorvastatin 20 MG tablet Commonly known as: LIPITOR   EPINEPHrine 0.3 mg/0.3 mL Soaj injection Commonly known as: Auvi-Q Use as directed for severe allergic reaction   FASENRA [place] Inject into the skin every 14 (fourteen) days.       Past Medical History:  Diagnosis Date  . Asthma   . Colonic polyp   . Gout   . Hyperlipemia   . Hypertension   . Hypogonadism in male   . IFG (impaired fasting glucose)   . Metabolic syndrome X   . Obesity   . OSA (obstructive sleep apnea)     History reviewed. No pertinent surgical history.  Review of systems negative except as noted in HPI / PMHx or noted below:  Review of Systems  Constitutional: Negative.   HENT: Negative.   Eyes: Negative.   Respiratory: Negative.   Cardiovascular: Negative.   Gastrointestinal: Negative.   Genitourinary: Negative.   Musculoskeletal: Negative.   Skin: Negative.   Neurological: Negative.   Endo/Heme/Allergies: Negative.   Psychiatric/Behavioral:  Negative.      Objective:   Vitals:   10/17/18 0940  BP: (!) 144/70  Pulse: (!) 52  Resp: 16  Temp: (!) 97.2 F (36.2 C)  SpO2: 94%   Height: 5\' 10"  (177.8 cm)  Weight: 255 lb 6.4 oz (115.8 kg)   Physical Exam Constitutional:      Appearance: He is not diaphoretic.  HENT:     Head: Normocephalic.     Right Ear: Tympanic membrane, ear canal and external ear normal.     Left Ear: Tympanic membrane, ear canal and external ear normal.     Nose: Nose normal. No mucosal edema or rhinorrhea.     Mouth/Throat:     Pharynx: Uvula midline. No oropharyngeal exudate.  Eyes:     Conjunctiva/sclera: Conjunctivae normal.  Neck:     Thyroid: No thyromegaly.     Trachea: Trachea normal. No tracheal tenderness or tracheal deviation.  Cardiovascular:     Rate and Rhythm: Normal rate and regular rhythm.     Heart sounds: Normal heart sounds, S1 normal and S2 normal. No murmur.  Pulmonary:     Effort: No respiratory distress.     Breath sounds: Normal breath sounds. No stridor. No wheezing or rales.  Lymphadenopathy:     Head:     Right side of head: No tonsillar adenopathy.     Left side of head: No tonsillar adenopathy.     Cervical: No cervical adenopathy.  Skin:    Findings:  No erythema or rash.     Nails: There is no clubbing.   Neurological:     Mental Status: He is alert.     Diagnostics:    Spirometry was performed and demonstrated an FEV1 of 2.38 at 69 % of predicted.  Assessment and Plan:   1. Severe persistent asthma without complication   2. Other allergic rhinitis     1.  Continue benralizumab AUTOINJECTOR (AUVI-Q 0.3)  2.  If needed:    A. ProAir HFA 2 puffs every 4-6 hours if needed  B.  Nasal saline  C.  OTC Mucinex DM 1-2 tablets 1-2 times per day  D.  Over-the-counter Zyrtec 10 mg 1 tablet once a day  3. "Action Plan" to treat inflammation:   A.  Dulera 200 - 2 inhalations twice a day with spacer  B.  Montelukast 10 mg -1 tablet 1 time per day   C.  OTC Rhinocort-1 spray each nostril 3-7 times per week  4. Obtain fall flu vaccine  5. Return to clinic in 12 months or earlier if problem  Jerry Mcgrath appears to be doing quite well with his benralizumab injections and basically has resolved all of his respiratory tract symptoms and has eliminated the use of all of his controller agents for both upper and lower airway disease.  He will continue on this therapy over the course of the next year and I will see him back in this clinic at that point in time or earlier if there is a problem.  We will be converting him from in clinic administration of benralizumab to home administration.  Allena Katz, MD Allergy / Immunology [facility]

## 2018-10-17 NOTE — Patient Instructions (Signed)
  1.  Continue benralizumab AUTOINJECTOR (AUVI-Q 0.3)  2. If needed:    A. ProAir HFA 2 puffs every 4-6 hours if needed  B.  Nasal saline  C.  OTC Mucinex DM 1-2 tablets 1-2 times per day  D.  Over-the-counter Zyrtec 10 mg 1 tablet once a day  3. "Action Plan" to treat inflammation:   A.  Dulera 200 - 2 inhalations twice a day with spacer  B.  Montelukast 10 mg -1 tablet 1 time per day  C.  OTC Rhinocort-1 spray each nostril 3-7 times per week  4. Obtain fall flu vaccine  5. Return to clinic in 12 months or earlier if problem

## 2018-10-18 ENCOUNTER — Encounter: Payer: Self-pay | Admitting: Allergy and Immunology

## 2018-11-09 DIAGNOSIS — G4733 Obstructive sleep apnea (adult) (pediatric): Secondary | ICD-10-CM | POA: Diagnosis not present

## 2018-11-14 DIAGNOSIS — Z03818 Encounter for observation for suspected exposure to other biological agents ruled out: Secondary | ICD-10-CM | POA: Diagnosis not present

## 2018-11-20 DIAGNOSIS — Z03818 Encounter for observation for suspected exposure to other biological agents ruled out: Secondary | ICD-10-CM | POA: Diagnosis not present

## 2018-12-06 DIAGNOSIS — J4550 Severe persistent asthma, uncomplicated: Secondary | ICD-10-CM | POA: Diagnosis not present

## 2018-12-12 ENCOUNTER — Ambulatory Visit: Payer: Self-pay | Admitting: *Deleted

## 2018-12-12 ENCOUNTER — Other Ambulatory Visit: Payer: Self-pay

## 2018-12-12 DIAGNOSIS — J4550 Severe persistent asthma, uncomplicated: Secondary | ICD-10-CM

## 2018-12-12 NOTE — Progress Notes (Signed)
Immunotherapy   Patient Details  Name: Jerry Mcgrath MRN: [id number] Date of Birth: 10-Apr-1956  12/12/2018  [name]  Patient self administered [name] to self today to the right of the umbilicus. Patient has been instructed to alternate sides every 8 weeks. Patient is aware to give next injection on 02/06/2019.  [name] 12/12/2018, 1:34 PM

## 2019-01-10 DIAGNOSIS — M109 Gout, unspecified: Secondary | ICD-10-CM | POA: Diagnosis not present

## 2019-01-10 DIAGNOSIS — E7849 Other hyperlipidemia: Secondary | ICD-10-CM | POA: Diagnosis not present

## 2019-01-10 DIAGNOSIS — Z125 Encounter for screening for malignant neoplasm of prostate: Secondary | ICD-10-CM | POA: Diagnosis not present

## 2019-01-10 DIAGNOSIS — Z0000 Encounter for general adult medical examination without abnormal findings: Secondary | ICD-10-CM | POA: Diagnosis not present

## 2019-01-10 DIAGNOSIS — I10 Essential (primary) hypertension: Secondary | ICD-10-CM | POA: Diagnosis not present

## 2019-01-10 DIAGNOSIS — E291 Testicular hypofunction: Secondary | ICD-10-CM | POA: Diagnosis not present

## 2019-01-17 DIAGNOSIS — E785 Hyperlipidemia, unspecified: Secondary | ICD-10-CM | POA: Diagnosis not present

## 2019-01-17 DIAGNOSIS — Z0000 Encounter for general adult medical examination without abnormal findings: Secondary | ICD-10-CM | POA: Diagnosis not present

## 2019-01-17 DIAGNOSIS — R82998 Other abnormal findings in urine: Secondary | ICD-10-CM | POA: Diagnosis not present

## 2019-01-17 DIAGNOSIS — R7301 Impaired fasting glucose: Secondary | ICD-10-CM | POA: Diagnosis not present

## 2019-01-17 DIAGNOSIS — E8881 Metabolic syndrome: Secondary | ICD-10-CM | POA: Diagnosis not present

## 2019-01-17 DIAGNOSIS — Z23 Encounter for immunization: Secondary | ICD-10-CM | POA: Diagnosis not present

## 2019-01-17 DIAGNOSIS — I10 Essential (primary) hypertension: Secondary | ICD-10-CM | POA: Diagnosis not present

## 2019-01-17 DIAGNOSIS — Z1331 Encounter for screening for depression: Secondary | ICD-10-CM | POA: Diagnosis not present

## 2019-01-22 ENCOUNTER — Other Ambulatory Visit: Payer: Self-pay | Admitting: Internal Medicine

## 2019-01-22 DIAGNOSIS — Z0000 Encounter for general adult medical examination without abnormal findings: Secondary | ICD-10-CM

## 2019-01-22 DIAGNOSIS — R221 Localized swelling, mass and lump, neck: Secondary | ICD-10-CM

## 2019-01-22 DIAGNOSIS — F17211 Nicotine dependence, cigarettes, in remission: Secondary | ICD-10-CM

## 2019-01-25 DIAGNOSIS — J4550 Severe persistent asthma, uncomplicated: Secondary | ICD-10-CM | POA: Diagnosis not present

## 2019-01-25 DIAGNOSIS — Z1212 Encounter for screening for malignant neoplasm of rectum: Secondary | ICD-10-CM | POA: Diagnosis not present

## 2019-01-30 ENCOUNTER — Other Ambulatory Visit: Payer: Self-pay | Admitting: Internal Medicine

## 2019-01-31 ENCOUNTER — Other Ambulatory Visit: Payer: BLUE CROSS/BLUE SHIELD

## 2019-01-31 ENCOUNTER — Ambulatory Visit
Admission: RE | Admit: 2019-01-31 | Discharge: 2019-01-31 | Disposition: A | Discharge status: Home / Self Care | Payer: BC Managed Care – PPO | Source: Ambulatory Visit | Attending: Internal Medicine | Admitting: Internal Medicine

## 2019-01-31 ENCOUNTER — Inpatient Hospital Stay: Admission: RE | Admit: 2019-01-31 | Payer: BLUE CROSS/BLUE SHIELD | Source: Ambulatory Visit

## 2019-01-31 DIAGNOSIS — E785 Hyperlipidemia, unspecified: Secondary | ICD-10-CM | POA: Diagnosis not present

## 2019-01-31 DIAGNOSIS — Z0000 Encounter for general adult medical examination without abnormal findings: Secondary | ICD-10-CM

## 2019-02-12 DIAGNOSIS — Z03818 Encounter for observation for suspected exposure to other biological agents ruled out: Secondary | ICD-10-CM | POA: Diagnosis not present

## 2019-03-28 DIAGNOSIS — J4550 Severe persistent asthma, uncomplicated: Secondary | ICD-10-CM | POA: Diagnosis not present

## 2019-05-01 ENCOUNTER — Other Ambulatory Visit: Payer: Self-pay

## 2019-05-01 ENCOUNTER — Ambulatory Visit (INDEPENDENT_AMBULATORY_CARE_PROVIDER_SITE_OTHER): Payer: BC Managed Care – PPO | Admitting: Neurology

## 2019-05-01 ENCOUNTER — Encounter: Payer: Self-pay | Admitting: Neurology

## 2019-05-01 VITALS — BP 143/60 | HR 79 | Temp 97.3°F | Ht 70.0 in | Wt 259.3 lb

## 2019-05-01 DIAGNOSIS — G4733 Obstructive sleep apnea (adult) (pediatric): Secondary | ICD-10-CM | POA: Diagnosis not present

## 2019-05-01 DIAGNOSIS — Z9989 Dependence on other enabling machines and devices: Secondary | ICD-10-CM

## 2019-05-01 NOTE — Patient Instructions (Signed)
Please continue using your CPAP regularly. While your insurance requires that you use CPAP at least 4 hours each night on 70% of the nights, I recommend, that you not skip any nights and use it throughout the night if you can. Getting used to CPAP and staying with the treatment long term does take time and patience and discipline. Untreated obstructive sleep apnea when it is moderate to severe can have an adverse impact on cardiovascular health and raise her risk for heart disease, arrhythmias, hypertension, congestive heart failure, stroke and diabetes. Untreated obstructive sleep apnea causes sleep disruption, nonrestorative sleep, and sleep deprivation. This can have an impact on your day to day functioning and cause daytime sleepiness and impairment of cognitive function, memory loss, mood disturbance, and problems focussing. Using CPAP regularly can improve these symptoms.  Keep up the good work! We can see you in 1 year, you can see one of our nurse practitioners as you are stable. 

## 2019-05-01 NOTE — Progress Notes (Signed)
Subjective:    Patient ID: Jerry Mcgrath is a 64 y.o. male.  HPI     Interim history:  Jerry Mcgrath is a 64 year old right-handed gentleman with an underlying medical history of hyperlipidemia, gout, hypertension, ED, hypogonadism, allergies and asthma, remote history of syncope, radial head and wrist fracture in August 2013, as well as obesity, who presents for follow-up consultation of his obstructive sleep apnea, established on CPAP therapy, for his yearly checkup. The patient is unaccompanied today. I last saw him his asthma and allergies.   Today, 05/01/2019: I reviewed his CPAP compliance data from 03/31/2019 through 04/29/2019, which is a total of 30 days, during which time he used his machine 24 days with percent use days greater than 4 hours at 80%, indicating very good compliance, average usage of 6 hours and 38 minutes, residual AHI at goal at 2.2/h, leak high with a 95th percentile at 33.9 L/min on a pressure of 10 cm with EPR of 3.  He also has a travel CPAP machine.  He reports that his CPAP is going well.  He is compliant with it and is up-to-date with his supplies.  He has had no recent illnesses and had a physical with his primary care physician. He has been on medication for his allergies and asthma, subcu injection with Fasenra every 8 weeks, this has been going well.  He uses his mini travel machine on the days that he does not use the standard CPAP machine, therefore, he has been 100% compliant with treatment.  He continues to benefit from CPAP therapy and usually uses nasal pillows.   The patient's allergies, current medications, family history, past medical history, past social history, past surgical history and problem list were reviewed and updated as appropriate.      Previously:   I saw him on 02/09/2017, at which time he was compliant with his CPAP and doing well.    I reviewed his CPAP compliance data from 03/21/2018 through 04/19/2018 which is a total of 30 days,  during which time he used his CPAP 22 days with percent used days greater than 4 hours at 73%, indicating adequate compliance with an average usage of 6 hours and 35 minutes, residual AHI at goal at 2.5 per hour, leak acceptable with the 95th percentile at 12.4 L/m on a pressure of 10 cm with EPR of 3.    Of note, he no showed for appointment on 11/09/2016. I saw him on 11/10/2015, at which time he was doing well. He was compliant with CPAP with a 97% compliance percentage. His residual AHI was low as well. He was advised to follow-up in one year as he was doing well.   I reviewed his CPAP compliance data from 01/08/2017 through 02/06/2017 which is a total of 30 days, during which time he used his CPAP 22 days with percent used days greater than 4 hours at 73%, indicating adequate compliance, with an average usage of 6 hours and 28 minutes, residual AHI 2.5 per hour, leak acceptable with the 95th percentile at 8.5 L/m on a pressure of 10 cm with EPR of 3.    I reviewed his CPAP compliance data from 10/07/2015 through 11/05/2015 which is a total of 30 days during which time he used his machine every night with percent used days greater than 4 hours at 97%, indicating excellent compliance with an average usage of 6 hours and 21 minutes, residual AHI 2.2 per hour, leak low, pressure at 10 cm with  EPR of 3.    I saw him on 04/29/2015, at which time we talked about his split-night sleep study results from October 2016 which confirmed his prior diagnosis of OSA. He was compliant with CPAP therapy.     I first met him on 01/15/2015 at the request of his primary care physician, at which time he reported a prior diagnosis of OSA and trying an oral appliance for treatment but no CPAP therapy before. I invited him back for sleep study. He had a split-night sleep study on 01/29/2015. I went over his test results with him in detail today. Baseline sleep efficiency was 68.6% with a sleep latency of 12.5 minutes and  wake after sleep onset of 51 minutes with moderate sleep fragmentation noted. He had a markedly elevated arousal index. He had an increased percentage of stage II sleep, and 5% of REM sleep with a REM latency of 115.5 minutes. He had mild PLMS without arousals. He had no significant EKG or EEG changes. He had moderate snoring. Total AHI was 40.3 per hour, average oxygen saturation was 91%, nadir was 79%. He was then titrated on CPAP. Sleep efficiency was 73.8% with a sleep latency of 11.5 minutes and wake after sleep onset of 62.5 minutes. He had a normal arousal index. He had slow-wave sleep at 16.8% in rem sleep elevated at 35.1%. He had an average oxygen saturation of 91%, nadir was 85%. Snoring was eliminated. He had no significant PLMS or EKG or EEG changes. CPAP was titrated from 5 cm to 10 cm. AHI was 0 per hour at the final pressure with supine REM sleep achieved. O2 nadir was 90% on the final pressure. Based on his test results are prescribed CPAP therapy for home use.    I reviewed his CPAP compliance data from 03/28/2015 through 04/26/2015 which is a total of 30 days during which time he used his machine 28 days with percent used days greater than 4 hours at 93%, indicating excellent compliance with an average usage of 6 hours and 8 minutes, residual AHI 1.5 per hour, leak low with the 95th percentile at 5.1 L/m on a pressure of 10 cm with EPR of 3.    01/15/2015: He was previously diagnosed with obstructive sleep apnea. He has not been on CPAP therapy. He has tried an oral appliance, but it has not helped his snoring. He reports snoring and excessive daytime somnolence. He had a sleep study about 6-7 years ago. CPAP was recommended but not pursued. Prior test results are not available for my review today. I reviewed your office note from 11/22/2014, which you kindly included. He has had recent blood work. Hemoglobin A1c was 5.6. His cholesterol was elevated with LDL at 140 and he has recently been  started on Lipitor once daily. He quit smoking in May 2010, he works full-time and owns his own business. He drinks alcohol, about 2 drinks per day, drinks one cup of coffee per day. He gained weight after he quit smoking. His bedtime is usually around 11 PM to midnight. He does not have trouble falling asleep or staying asleep. He has nocturia usually once per night. He has occasional restless leg symptoms which are mild. His wife does not sleep in the same bedroom because of his loud snoring. He has a tendency to fall asleep inadvertently especially when watching TV at night. His rise time is around 7 AM. He feels adequately rested first thing in the morning and denies morning headaches. He  has no family history of OSA. He has not noticed any lower extremity swelling. He is not sure if he moves his legs or twitches his legs in his sleep.  His Past Medical History Is Significant For: Past Medical History:  Diagnosis Date  . Asthma   . Colonic polyp   . Gout   . Hyperlipemia   . Hypertension   . Hypogonadism in male   . IFG (impaired fasting glucose)   . Metabolic syndrome X   . Obesity   . OSA (obstructive sleep apnea)     His Past Surgical History Is Significant For: No past surgical history on file.  His Family History Is Significant For: Family History  Problem Relation Age of Onset  . Alzheimer's disease Mother   . Diverticulitis Mother   . Allergic rhinitis Mother   . Heart attack Father   . Allergic rhinitis Father     His Social History Is Significant For: Social History   Socioeconomic History  . Marital status: Married    Spouse name: Not on file  . Number of children: Not on file  . Years of education: College  . Highest education level: Not on file  Occupational History  . Occupation: [employer]  Tobacco Use  . Smoking status: Former Research scientist (life sciences)  . Smokeless tobacco: Never Used  . Tobacco comment: Quit 2010  Substance and Sexual Activity  . Alcohol use: Yes     Alcohol/week: 0.0 standard drinks    Comment: daily  . Drug use: Not on file  . Sexual activity: Not on file  Other Topics Concern  . Not on file  Social History Narrative   Drink 1 cup of coffee a day    Social Determinants of Health   Financial Resource Strain:   . Difficulty of Paying Living Expenses: Not on file  Food Insecurity:   . Worried About Charity fundraiser in the Last Year: Not on file  . Ran Out of Food in the Last Year: Not on file  Transportation Needs:   . Lack of Transportation (Medical): Not on file  . Lack of Transportation (Non-Medical): Not on file  Physical Activity:   . Days of Exercise per Week: Not on file  . Minutes of Exercise per Session: Not on file  Stress:   . Feeling of Stress : Not on file  Social Connections:   . Frequency of Communication with Friends and Family: Not on file  . Frequency of Social Gatherings with Friends and Family: Not on file  . Attends Religious Services: Not on file  . Active Member of Clubs or Organizations: Not on file  . Attends Archivist Meetings: Not on file  . Marital Status: Not on file    His Allergies Are:  No Known Allergies:   His Current Medications Are:  Outpatient Encounter Medications as of 05/01/2019  Medication Sig  . atorvastatin (LIPITOR) 20 MG tablet   . Benralizumab (FASENRA [place]) Inject into the skin. Every 56 days  . EPINEPHrine (AUVI-Q) 0.3 mg/0.3 mL IJ SOAJ injection Use as directed for severe allergic reaction   Facility-Administered Encounter Medications as of 05/01/2019  Medication  . Benralizumab SOSY 30 mg  :  Review of Systems:  Out of a complete 14 point review of systems, all are reviewed and negative with the exception of these symptoms as listed below: Review of Systems  Neurological:       Reports CPAP is going well, no issues.  Objective:  Neurological Exam  Physical Exam Physical Examination:   Vitals:   05/01/19 1448  BP: (!) 143/60  Pulse: 79   Temp: (!) 97.3 F (36.3 C)    General Examination: The patient is a very pleasant 64 y.o. male in no acute distress. He appears well-developed and well-nourished and well groomed.   HEENT:Normocephalic, atraumatic, pupils are equal, round and reactive to light, corrective eyeglasses.Extraocular tracking is good. Hearing is grossly intact. Face is symmetric with normal facial animation and normal facial sensation. Speech is clear with no dysarthria noted. There is no hypophonia. There is no lip, neck/head, jaw or voice tremor. Neck shows FROM. Oropharynx exam reveals: Benign findings, tongue protrudes centrally in palate elevates symmetrically.  No significant mouth dryness. No carotid bruits.   Chest:Clear to auscultation without wheezing, rhonchi or crackles noted.  Heart:S1+S2+0, regular and normal without murmurs, rubs or gallops noted.   Abdomen:Soft, non-tender and non-distended.  Extremities:There is no obvious edema in the distal lower extremities bilaterally.   Skin: Warm and dry without trophic changes noted.   Musculoskeletal: exam reveals no obvious joint deformities, tenderness or joint swelling or erythema.   Neurologically:  Mental status: The patient is awake, alert and oriented in all 4 spheres. His immediate and remote memory, attention, language skills and fund of knowledge are appropriate. There is no evidence of aphasia, agnosia, apraxia or anomia. Speech is clear with normal prosody and enunciation. Thought process is linear. Mood is normal and affect is normal.  Cranial nerves II - XII are as described above under HEENT exam.  Motor exam: Normal bulk, strength and tone is noted. There is noresting or actiontremor.  Sensory exam: intact to light touch in the upper and lower extremities.  Gait, station and balance: He stands easily. No veering to one side is noted. No leaning to one side is noted. Posture is age-appropriate and stance is narrow based.  Gait shows normal stride length and normal pace. No problems turning are noted.  Assessment and Plan:  In summary, Jerry Mcgrath is a very pleasant 64 year old male with an underlying medical history of hyperlipidemia, gout, hypertension, ED, hypogonadism, allergies and asthma, now on Fasenra [place] inj q8weekly, remote syncope over 10 years ago, radial head and wrist fracture in August 2013, as well as obesity, whopresents for follow-up consultation of his obstructive sleep apnea, well established on CPAP therapy. He has a cleaning machine too for the equipment, and a travel machine, And is 100% compliant between his 2 CPAP machines.  Of note, he had a split-night sleep study on 01/29/2015 which indicated severe sleep apnea. He Is typically up-to-date with his supplies.  I renewed his prescription for his CPAP supplies for this year.  He is advised to follow-up routinely to see the nurse practitioner for sleep apnea follow-up in 1 year.  I answered all his questions today and he was in agreement.

## 2019-05-11 ENCOUNTER — Ambulatory Visit: Payer: BC Managed Care – PPO

## 2019-05-16 ENCOUNTER — Ambulatory Visit: Payer: BC Managed Care – PPO

## 2019-05-17 DIAGNOSIS — J4550 Severe persistent asthma, uncomplicated: Secondary | ICD-10-CM | POA: Diagnosis not present

## 2019-05-19 ENCOUNTER — Ambulatory Visit: Payer: Self-pay | Attending: Internal Medicine

## 2019-05-19 DIAGNOSIS — Z23 Encounter for immunization: Secondary | ICD-10-CM | POA: Insufficient documentation

## 2019-05-19 NOTE — Progress Notes (Signed)
   Covid-19 Vaccination Clinic  Name:  Jerry Mcgrath    MRN: [id number] DOB: June 01, 1955  05/19/2019  Jerry Mcgrath was observed post Covid-19 immunization for 15 minutes without incidence. He was provided with Vaccine Information Sheet and instruction to access the V-Safe system.   Jerry Mcgrath was instructed to call 911 with any severe reactions post vaccine: Marland Kitchen Difficulty breathing  . Swelling of your face and throat  . A fast heartbeat  . A bad rash all over your body  . Dizziness and weakness    Immunizations Administered    Name Date Dose VIS Date Route   Pfizer COVID-19 Vaccine 05/19/2019  5:19 PM 0.3 mL 03/23/2019 Intramuscular   Manufacturer: [place]   Lot: [id number]   [place]: [id number]

## 2019-05-25 DIAGNOSIS — H5203 Hypermetropia, bilateral: Secondary | ICD-10-CM | POA: Diagnosis not present

## 2019-06-06 DIAGNOSIS — G4733 Obstructive sleep apnea (adult) (pediatric): Secondary | ICD-10-CM | POA: Diagnosis not present

## 2019-06-13 ENCOUNTER — Ambulatory Visit: Payer: Self-pay | Attending: Internal Medicine

## 2019-06-13 DIAGNOSIS — Z23 Encounter for immunization: Secondary | ICD-10-CM | POA: Insufficient documentation

## 2019-06-13 NOTE — Progress Notes (Signed)
   Covid-19 Vaccination Clinic  Name:  Jerry Mcgrath    MRN: [id number] DOB: 05-16-1955  06/13/2019  [name] was observed post Covid-19 immunization for 15 minutes without incident. He was provided with Vaccine Information Sheet and instruction to access the V-Safe system.   [name] was instructed to call 911 with any severe reactions post vaccine: Marland Kitchen Difficulty breathing  . Swelling of face and throat  . A fast heartbeat  . A bad rash all over body  . Dizziness and weakness   Immunizations Administered    Name Date Dose VIS Date Route   Pfizer COVID-19 Vaccine 06/13/2019  2:39 PM 0.3 mL 03/23/2019 Intramuscular   Manufacturer: [place]   Lot: [id number]   [place]: [id number]

## 2019-11-05 DIAGNOSIS — M542 Cervicalgia: Secondary | ICD-10-CM | POA: Diagnosis not present

## 2019-11-27 ENCOUNTER — Encounter: Payer: Self-pay | Admitting: Allergy and Immunology

## 2019-11-27 ENCOUNTER — Other Ambulatory Visit: Payer: Self-pay

## 2019-11-27 ENCOUNTER — Ambulatory Visit (INDEPENDENT_AMBULATORY_CARE_PROVIDER_SITE_OTHER): Payer: BC Managed Care – PPO | Admitting: Allergy and Immunology

## 2019-11-27 VITALS — BP 152/80 | HR 45 | Temp 98.1°F | Resp 18 | Ht 70.0 in | Wt 262.0 lb

## 2019-11-27 DIAGNOSIS — J4550 Severe persistent asthma, uncomplicated: Secondary | ICD-10-CM | POA: Diagnosis not present

## 2019-11-27 DIAGNOSIS — J3089 Other allergic rhinitis: Secondary | ICD-10-CM | POA: Diagnosis not present

## 2019-11-27 MED ORDER — EPINEPHRINE 0.3 MG/0.3ML IJ SOAJ: 0.3000 mg | Freq: Once | INTRAMUSCULAR | 1 refills | Status: AC

## 2019-11-27 MED ORDER — MONTELUKAST SODIUM 10 MG PO TABS: 10.0000 mg | ORAL_TABLET | Freq: Every day | ORAL | 11 refills | Status: DC

## 2019-11-27 MED ORDER — ALBUTEROL SULFATE HFA 108 (90 BASE) MCG/ACT IN AERS: 2.0000 | INHALATION_SPRAY | Freq: Four times a day (QID) | RESPIRATORY_TRACT | 1 refills | Status: DC | PRN

## 2019-11-27 NOTE — Patient Instructions (Addendum)
  1.  Continue benralizumab AUTOINJECTOR (AUVI-Q 0.3)  2. If needed:    A.  ProAir HFA 2 puffs every 4-6 hours if needed  B.  Nasal saline  C.  OTC Mucinex DM 1-2 tablets 1-2 times per day  D.  Over-the-counter Zyrtec 10 mg 1 tablet once a day  E.  Montelukast 10 mg - 1 tablet 1 time per day  3. Obtain fall flu vaccine (and Covid booster when available)  4. Return to clinic in 12 months or earlier if problem

## 2019-11-27 NOTE — Progress Notes (Signed)
[place]   Follow-up Note  Referring Provider: Marton Redwood, MD Primary Provider: Marton Redwood, MD Date of Office Visit: 11/27/2019  Subjective:   Jerry Mcgrath (DOB: 1955-08-05) is a 64 y.o. 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  His last visit to this clinic was 17 October 2018.  Overall he has really done well over the course of the past year with very little issues revolving around his lower airway.  He has not required a systemic steroid or an antibiotic treat an airway issue, has not required a urgent care or emergency room evaluation, has not required an emergent visit with his family doctor for an airway issue, rarely uses a short acting bronchodilator, while he continues on benralizumab injections every 8 weeks.  He has noticed that he had a little bit more drainage in his throat over the course of the past month or so. He would like to restart his montelukast to address this issue.  As well, he went to the beach with multiple family members and they all developed a "cold" towards the tail end of the visit and his symptoms started about 3 to 4 days ago with nasal congestion and sneezing but no other symptoms.  He can still smell and taste and has not had any fever or systemic or constitutional symptoms.  He has received 2 Pfizer Covid vaccines.  Allergies as of 11/27/2019   No Known Allergies     Medication List      atorvastatin 20 MG tablet Commonly known as: LIPITOR   EPINEPHrine 0.3 mg/0.3 mL Soaj injection Commonly known as: Auvi-Q Use as directed for severe allergic reaction   FASENRA [place] Inject into the skin. Every 56 days       Past Medical History:  Diagnosis Date  . Asthma   . Colonic polyp   . Gout   . Hyperlipemia   . Hypertension     . Hypogonadism in male   . IFG (impaired fasting glucose)   . Metabolic syndrome X   . Obesity   . OSA (obstructive sleep apnea)     History reviewed. No pertinent surgical history.  Review of systems negative except as noted in HPI / PMHx or noted below:  Review of Systems  Constitutional: Negative.   HENT: Negative.   Eyes: Negative.   Respiratory: Negative.   Cardiovascular: Negative.   Gastrointestinal: Negative.   Genitourinary: Negative.   Musculoskeletal: Negative.   Skin: Negative.   Neurological: Negative.   Endo/Heme/Allergies: Negative.   Psychiatric/Behavioral: Negative.      Objective:   Vitals:   11/27/19 1030  BP: (!) 152/80  Pulse: (!) 45  Resp: 18  Temp: 98.1 F (36.7 C)  SpO2: 95%   Height: 5\' 10"  (177.8 cm)  Weight: 262 lb (118.8 kg)   Physical Exam Constitutional:      Appearance: He is not diaphoretic.  HENT:     Head: Normocephalic.     Right Ear: Tympanic membrane, ear canal and external ear normal.     Left Ear: Tympanic membrane, ear canal and external ear normal.     Nose: Nose normal. No mucosal edema or rhinorrhea.     Mouth/Throat:     Pharynx: Uvula midline. No oropharyngeal exudate.  Eyes:  Conjunctiva/sclera: Conjunctivae normal.  Neck:     Thyroid: No thyromegaly.     Trachea: Trachea normal. No tracheal tenderness or tracheal deviation.  Cardiovascular:     Rate and Rhythm: Normal rate and regular rhythm.     Heart sounds: Normal heart sounds, S1 normal and S2 normal. No murmur heard.   Pulmonary:     Effort: No respiratory distress.     Breath sounds: Normal breath sounds. No stridor. No wheezing or rales.  Lymphadenopathy:     Head:     Right side of head: No tonsillar adenopathy.     Left side of head: No tonsillar adenopathy.     Cervical: No cervical adenopathy.  Skin:    Findings: No erythema or rash.     Nails: There is no clubbing.  Neurological:     Mental Status: He is alert.     Diagnostics:     Spirometry was performed and demonstrated an FEV1 of 2.14 at 63 % of predicted.  The patient had an Asthma Control Test with the following results: ACT Total Score: 25.    Assessment and Plan:   1. Not well controlled severe persistent asthma   2. Other allergic rhinitis     1.  Continue benralizumab AUTOINJECTOR (AUVI-Q 0.3)  2. If needed:    A.  ProAir HFA 2 puffs every 4-6 hours if needed  B.  Nasal saline  C.  OTC Mucinex DM 1-2 tablets 1-2 times per day  D.  Over-the-counter Zyrtec 10 mg 1 tablet once a day  E.  Montelukast 10 mg - 1 tablet 1 time per day  3. Obtain fall flu vaccine (and Covid booster when available)  4. Return to clinic in 12 months or earlier if problem  Jerry Mcgrath has really done well since he started benralizumab and it is really changed his airway phenotype significantly and at this point in time it does not really appear as though he requires any additional therapy although he had a little bit more drainage in his throat and he wants to restart his montelukast.  He does appear to have contracted a viral respiratory tract infection as did multiple members of his family during a beach trip but he does not appear to be very ill and hopefully within a week this will resolve.  If he has problems during the interval he can contact me for further evaluation but otherwise I will see him back in his clinic in 1 year or earlier if there is a problem.  Allena Katz, MD Allergy / Immunology [place]

## 2019-11-28 ENCOUNTER — Encounter: Payer: Self-pay | Admitting: Allergy and Immunology

## 2019-12-18 ENCOUNTER — Other Ambulatory Visit: Payer: Self-pay | Admitting: *Deleted

## 2019-12-18 MED ORDER — FASENRA PEN 30 MG/ML ~~LOC~~ SOAJ: 30.0000 mg | SUBCUTANEOUS | 6 refills | Status: DC

## 2019-12-19 DIAGNOSIS — Z20822 Contact with and (suspected) exposure to covid-19: Secondary | ICD-10-CM | POA: Diagnosis not present

## 2020-01-17 DIAGNOSIS — M109 Gout, unspecified: Secondary | ICD-10-CM | POA: Diagnosis not present

## 2020-01-17 DIAGNOSIS — E785 Hyperlipidemia, unspecified: Secondary | ICD-10-CM | POA: Diagnosis not present

## 2020-01-17 DIAGNOSIS — Z0000 Encounter for general adult medical examination without abnormal findings: Secondary | ICD-10-CM | POA: Diagnosis not present

## 2020-01-17 DIAGNOSIS — Z125 Encounter for screening for malignant neoplasm of prostate: Secondary | ICD-10-CM | POA: Diagnosis not present

## 2020-01-17 DIAGNOSIS — R7301 Impaired fasting glucose: Secondary | ICD-10-CM | POA: Diagnosis not present

## 2020-01-25 ENCOUNTER — Other Ambulatory Visit: Payer: Self-pay | Admitting: Internal Medicine

## 2020-01-25 DIAGNOSIS — Z1389 Encounter for screening for other disorder: Secondary | ICD-10-CM | POA: Diagnosis not present

## 2020-01-25 DIAGNOSIS — I10 Essential (primary) hypertension: Secondary | ICD-10-CM | POA: Diagnosis not present

## 2020-01-25 DIAGNOSIS — Z0000 Encounter for general adult medical examination without abnormal findings: Secondary | ICD-10-CM | POA: Diagnosis not present

## 2020-01-25 DIAGNOSIS — R82998 Other abnormal findings in urine: Secondary | ICD-10-CM | POA: Diagnosis not present

## 2020-01-25 DIAGNOSIS — R001 Bradycardia, unspecified: Secondary | ICD-10-CM | POA: Diagnosis not present

## 2020-01-25 DIAGNOSIS — F17211 Nicotine dependence, cigarettes, in remission: Secondary | ICD-10-CM

## 2020-01-25 DIAGNOSIS — R221 Localized swelling, mass and lump, neck: Secondary | ICD-10-CM

## 2020-01-25 DIAGNOSIS — Z1331 Encounter for screening for depression: Secondary | ICD-10-CM | POA: Diagnosis not present

## 2020-01-25 DIAGNOSIS — Z23 Encounter for immunization: Secondary | ICD-10-CM | POA: Diagnosis not present

## 2020-02-01 DIAGNOSIS — Z1212 Encounter for screening for malignant neoplasm of rectum: Secondary | ICD-10-CM | POA: Diagnosis not present

## 2020-02-11 ENCOUNTER — Ambulatory Visit
Admission: RE | Admit: 2020-02-11 | Discharge: 2020-02-11 | Disposition: A | Discharge status: Home / Self Care | Payer: BC Managed Care – PPO | Source: Ambulatory Visit | Attending: Internal Medicine | Admitting: Internal Medicine

## 2020-02-11 DIAGNOSIS — Z87891 Personal history of nicotine dependence: Secondary | ICD-10-CM | POA: Diagnosis not present

## 2020-02-11 DIAGNOSIS — F17211 Nicotine dependence, cigarettes, in remission: Secondary | ICD-10-CM

## 2020-02-11 DIAGNOSIS — J439 Emphysema, unspecified: Secondary | ICD-10-CM | POA: Diagnosis not present

## 2020-02-11 DIAGNOSIS — J8410 Pulmonary fibrosis, unspecified: Secondary | ICD-10-CM | POA: Diagnosis not present

## 2020-02-11 DIAGNOSIS — I2510 Atherosclerotic heart disease of native coronary artery without angina pectoris: Secondary | ICD-10-CM | POA: Diagnosis not present

## 2020-02-13 ENCOUNTER — Ambulatory Visit
Admission: RE | Admit: 2020-02-13 | Discharge: 2020-02-13 | Disposition: A | Discharge status: Home / Self Care | Payer: BC Managed Care – PPO | Source: Ambulatory Visit | Attending: Internal Medicine | Admitting: Internal Medicine

## 2020-02-13 DIAGNOSIS — R221 Localized swelling, mass and lump, neck: Secondary | ICD-10-CM | POA: Diagnosis not present

## 2020-02-13 MED ORDER — IOPAMIDOL (ISOVUE-300) INJECTION 61%
75.0000 mL | Freq: Once | INTRAVENOUS | Status: AC | PRN
  Administered 2020-02-13: 75 mL via INTRAVENOUS

## 2020-04-03 DIAGNOSIS — D170 Benign lipomatous neoplasm of skin and subcutaneous tissue of head, face and neck: Secondary | ICD-10-CM | POA: Diagnosis not present

## 2020-04-30 ENCOUNTER — Other Ambulatory Visit: Payer: Self-pay

## 2020-04-30 ENCOUNTER — Ambulatory Visit (INDEPENDENT_AMBULATORY_CARE_PROVIDER_SITE_OTHER): Payer: BC Managed Care – PPO | Admitting: Adult Health

## 2020-04-30 VITALS — BP 150/97 | HR 79 | Ht 70.0 in | Wt 246.0 lb

## 2020-04-30 DIAGNOSIS — Z9989 Dependence on other enabling machines and devices: Secondary | ICD-10-CM | POA: Diagnosis not present

## 2020-04-30 DIAGNOSIS — G4733 Obstructive sleep apnea (adult) (pediatric): Secondary | ICD-10-CM

## 2020-04-30 NOTE — Patient Instructions (Signed)
Continue using CPAP nightly and greater than 4 hours each night °If your symptoms worsen or you develop new symptoms please let us know.  ° °

## 2020-04-30 NOTE — Progress Notes (Addendum)
PATIENT: Jerry Mcgrath DOB: Feb 23, 1956  REASON FOR VISIT: follow up HISTORY FROM: patient  HISTORY OF PRESENT ILLNESS: Today 04/30/20:  Jerry Mcgrath is a 65 year old male with a history of obstructive sleep apnea on CPAP.  He reports that his CPAP is working well for him.  He denies any new issues.  His CPAP download compliance report is below.  Compliance Report Usage 03/31/2020 - 04/29/2020 Usage days 28/30 days (93%) >= 4 hours 28 days (93%) Average usage (days used) 7 hours 13 minutes  AirSense 10 AutoSet Serial number [id number] Mode CPAP Set pressure 10 cmH2O EPR Fulltime EPR level 3  Therapy Leaks - L/min Median: 4.3 95th percentile: 18.9 Maximum: 28.1 Events per hour AI: 0.9 HI: 0.5 AHI: 1.4 Apnea Index Central: 0.2 Obstructive: 0.6 Unknown: 0.1   HISTORY (copied from Dr. Guadelupe Sabin note)   05/01/2019: I reviewed his CPAP compliance data from 03/31/2019 through 04/29/2019, which is a total of 30 days, during which time he used his machine 24 days with percent use days greater than 4 hours at 80%, indicating very good compliance, average usage of 6 hours and 38 minutes, residual AHI at goal at 2.2/h, leak high with a 95th percentile at 33.9 L/min on a pressure of 10 cm with EPR of 3.  He also has a travel CPAP machine.  He reports that his CPAP is going well.  He is compliant with it and is up-to-date with his supplies.  He has had no recent illnesses and had a physical with his primary care physician. He has been on medication for his allergies and asthma, subcu injection with Fasenra every 8 weeks, this has been going well.  He uses his mini travel machine on the days that he does not use the standard CPAP machine, therefore, he has been 100% compliant with treatment.  He continues to benefit from CPAP therapy and usually uses nasal pillows.  REVIEW OF SYSTEMS: Out of a complete 14 system review of symptoms, the patient complains only of the following symptoms, and all  other reviewed systems are negative.  FSS 9 ESS 5  ALLERGIES: No Known Allergies  HOME MEDICATIONS: Outpatient Medications Prior to Visit  Medication Sig Dispense Refill  . albuterol (VENTOLIN HFA) 108 (90 Base) MCG/ACT inhaler Inhale 2 puffs into the lungs every 6 (six) hours as needed for wheezing or shortness of breath. 18 g 1  . atorvastatin (LIPITOR) 20 MG tablet   5  . Benralizumab (FASENRA PEN) 30 MG/ML SOAJ Inject 30 mg into the skin every 8 (eight) weeks. 1 mL 6  . EPINEPHrine (AUVI-Q) 0.3 mg/0.3 mL IJ SOAJ injection Use as directed for severe allergic reaction 2 Device 1  . montelukast (SINGULAIR) 10 MG tablet Take 1 tablet (10 mg total) by mouth at bedtime. 30 tablet 11   Facility-Administered Medications Prior to Visit  Medication Dose Route Frequency Provider Last Rate Last Admin  . Benralizumab SOSY 30 mg  30 mg Subcutaneous Q8 Weeks Jiles Prows, MD   30 mg at 10/17/18 1251    PAST MEDICAL HISTORY: Past Medical History:  Diagnosis Date  . Asthma   . Colonic polyp   . Gout   . Hyperlipemia   . Hypertension   . Hypogonadism in male   . IFG (impaired fasting glucose)   . Metabolic syndrome X   . Obesity   . OSA (obstructive sleep apnea)     PAST SURGICAL HISTORY: No past surgical history on file.  FAMILY  HISTORY: Family History  Problem Relation Age of Onset  . Alzheimer's disease Mother   . Diverticulitis Mother   . Allergic rhinitis Mother   . Heart attack Father   . Allergic rhinitis Father     SOCIAL HISTORY: Social History   Socioeconomic History  . Marital status: Married    Spouse name: Not on file  . Number of children: Not on file  . Years of education: College  . Highest education level: Not on file  Occupational History  . Occupation: [employer]  Tobacco Use  . Smoking status: Former Research scientist (life sciences)  . Smokeless tobacco: Never Used  . Tobacco comment: Quit 2010  Vaping Use  . Vaping Use: Never used  Substance and Sexual Activity   . Alcohol use: Yes    Alcohol/week: 0.0 standard drinks    Comment: daily  . Drug use: Never  . Sexual activity: Not on file  Other Topics Concern  . Not on file  Social History Narrative   Drink 1 cup of coffee a day    Social Determinants of Health   Financial Resource Strain: Not on file  Food Insecurity: Not on file  Transportation Needs: Not on file  Physical Activity: Not on file  Stress: Not on file  Social Connections: Not on file  Intimate Partner Violence: Not on file      PHYSICAL EXAM  There were no vitals filed for this visit. There is no height or weight on file to calculate BMI.  Generalized: Well developed, in no acute distress  Chest: Lungs clear to auscultation bilaterally  Neurological examination  Mentation: Alert oriented to time, place, history taking. Follows all commands speech and language fluent Cranial nerve II-XII: Extraocular movements were full, visual field were full on confrontational test Head turning and shoulder shrug  were normal and symmetric. Motor: The motor testing reveals 5 over 5 strength of all 4 extremities. Good symmetric motor tone is noted throughout.  Sensory: Sensory testing is intact to soft touch on all 4 extremities. No evidence of extinction is noted.  Gait and station: Gait is normal.    DIAGNOSTIC DATA (LABS, IMAGING, TESTING) - I reviewed patient records, labs, notes, testing and imaging myself where available.  Lab Results  Component Value Date   WBC 10.3 10/04/2017   HGB 14.8 10/04/2017   HCT 42.1 10/04/2017   MCV 96 10/04/2017   PLT 302 10/04/2017      ASSESSMENT AND PLAN 65 y.o. year old male  has a past medical history of Asthma, Colonic polyp, Gout, Hyperlipemia, Hypertension, Hypogonadism in male, IFG (impaired fasting glucose), Metabolic syndrome X, Obesity, and OSA (obstructive sleep apnea). here with:  1. OSA on CPAP  - CPAP compliance excellent - Good treatment of AHI  - Encourage patient  to use CPAP nightly and > 4 hours each night - F/U in 1 year or sooner if needed   I spent 30 minutes of face-to-face and non-face-to-face time with patient.  This included previsit chart review, lab review, study review, order entry, electronic health record documentation, patient education.  Ward Givens, MSN, NP-C 04/30/2020, 2:28 PM [facility] [address] [place], [place] [address] [phone]  I reviewed the above note and documentation by the Nurse Practitioner and agree with the history, exam, assessment and plan as outlined above. I was available for consultation. Star Age, MD, PhD [facility])

## 2020-05-01 ENCOUNTER — Ambulatory Visit: Payer: BC Managed Care – PPO | Admitting: Adult Health

## 2020-06-11 ENCOUNTER — Ambulatory Visit: Payer: Self-pay | Admitting: Surgery

## 2020-06-11 DIAGNOSIS — M7989 Other specified soft tissue disorders: Secondary | ICD-10-CM | POA: Diagnosis not present

## 2020-06-27 DIAGNOSIS — M109 Gout, unspecified: Secondary | ICD-10-CM | POA: Diagnosis not present

## 2020-07-22 NOTE — Patient Instructions (Addendum)
DUE TO COVID-19 ONLY ONE VISITOR IS ALLOWED TO COME WITH YOU AND STAY IN THE WAITING ROOM ONLY DURING PRE OP AND PROCEDURE DAY OF SURGERY. THE 1 VISITOR  MAY VISIT WITH YOU AFTER SURGERY IN YOUR PRIVATE ROOM DURING VISITING HOURS ONLY!  YOU NEED TO HAVE A COVID 19 TEST ON__4/18_____ @__2 :50_____, THIS TEST MUST BE DONE BEFORE SURGERY,  COVID TESTING SITE [id number] [id number], IT IS ON THE RIGHT GOING OUT [address] APPROXIMATELY  2 MINUTES PAST [employer] ON THE RIGHT. ONCE YOUR COVID TEST IS COMPLETED,  PLEASE BEGIN THE QUARANTINE INSTRUCTIONS AS OUTLINED IN YOUR HANDOUT.                [name]    Your procedure is scheduled on: 07/31/20   Report to [facility] Main  Entrance   Report to admitting at   6:30 AM     Call this number if you have problems the morning of surgery [phone]    Remember: Do not eat food after Midnight.  You may have clear liquids until 5:00 am   BRUSH YOUR TEETH MORNING OF SURGERY AND RINSE YOUR MOUTH OUT, NO CHEWING GUM CANDY OR MINTS.     Take these medicines the morning of surgery with A SIP OF WATER: Use your inhaler and  Bring it with you to the hospital                                 You may not have any metal on your body including              piercings  Do not wear jewelry, lotions, powders or deodorant              Men may shave face and neck.   Do not bring valuables to the hospital. [place].  Contacts, dentures or bridgework may not be worn into surgery. .     Patients discharged the day of surgery will not be allowed to drive home.   IF YOU ARE HAVING SURGERY AND GOING HOME THE SAME DAY, YOU MUST HAVE AN ADULT TO DRIVE YOU HOME AND BE WITH YOU FOR 24 HOURS. YOU MAY GO HOME BY TAXI OR UBER OR ORTHERWISE, BUT AN ADULT MUST ACCOMPANY YOU HOME AND STAY WITH YOU FOR 24 HOURS.  Name and phone number of your driver:  Special  Instructions: N/A              Please read over the following fact sheets you were given: _____________________________________________________________________             [facility] - Preparing for Surgery Before surgery, you can play an important role.  Because skin is not sterile, your skin needs to be as free of germs as possible.  You can reduce the number of germs on your skin by washing with CHG (chlorahexidine gluconate) soap before surgery.  CHG is an antiseptic cleaner which kills germs and bonds with the skin to continue killing germs even after washing. Please DO NOT use if you have an allergy to CHG or antibacterial soaps.  If your skin becomes reddened/irritated stop using the CHG and inform your nurse when you arrive at Short Stay.   You may shave your face/neck.  Please  follow these instructions carefully:  1.  Shower with CHG Soap the night before surgery and the  morning of Surgery.  2.  If you choose to wash your hair, wash your hair first as usual with your  normal  shampoo.  3.  After you shampoo, rinse your hair and body thoroughly to remove the  shampoo.                                        4.  Use CHG as you would any other liquid soap.  You can apply chg directly  to the skin and wash                       Gently with a scrungie or clean washcloth.  5.  Apply the CHG Soap to your body ONLY FROM THE NECK DOWN.   Do not use on face/ open                           Wound or open sores. Avoid contact with eyes, ears mouth and genitals (private parts).                       Wash face,  Genitals (private parts) with your normal soap.             6.  Wash thoroughly, paying special attention to the area where your surgery  will be performed.  7.  Thoroughly rinse your body with warm water from the neck down.  8.  DO NOT shower/wash with your normal soap after using and rinsing off  the CHG Soap.             9.  Pat yourself dry with a clean towel.            10.  Wear  clean pajamas.            11.  Place clean sheets on your bed the night of your first shower and do not  sleep with pets. Day of Surgery : Do not apply any lotions/deodorants the morning of surgery.  Please wear clean clothes to the hospital/surgery center.  FAILURE TO FOLLOW THESE INSTRUCTIONS MAY RESULT IN THE CANCELLATION OF YOUR SURGERY PATIENT SIGNATURE_________________________________  NURSE SIGNATURE__________________________________  ________________________________________________________________________

## 2020-07-23 ENCOUNTER — Encounter (HOSPITAL_COMMUNITY)
Admission: RE | Admit: 2020-07-23 | Discharge: 2020-07-23 | Disposition: A | Discharge status: Home / Self Care | Payer: BC Managed Care – PPO | Source: Ambulatory Visit | Attending: Surgery | Admitting: Surgery

## 2020-07-23 ENCOUNTER — Other Ambulatory Visit: Payer: Self-pay

## 2020-07-23 ENCOUNTER — Encounter (HOSPITAL_COMMUNITY): Payer: Self-pay

## 2020-07-23 DIAGNOSIS — I491 Atrial premature depolarization: Secondary | ICD-10-CM | POA: Insufficient documentation

## 2020-07-23 DIAGNOSIS — Z01818 Encounter for other preprocedural examination: Secondary | ICD-10-CM | POA: Diagnosis not present

## 2020-07-23 DIAGNOSIS — I4510 Unspecified right bundle-branch block: Secondary | ICD-10-CM | POA: Insufficient documentation

## 2020-07-23 LAB — BASIC METABOLIC PANEL
Anion gap: 9 (ref 5–15)
BUN: 13 mg/dL (ref 8–23)
CO2: 24 mmol/L (ref 22–32)
Calcium: 9.2 mg/dL (ref 8.9–10.3)
Chloride: 105 mmol/L (ref 98–111)
Creatinine, Ser: 0.81 mg/dL (ref 0.61–1.24)
GFR, Estimated: >60 mL/min (ref >60)
Glucose, Bld: 91 mg/dL (ref 70–99)
Potassium: 3.8 mmol/L (ref 3.5–5.1)
Sodium: 138 mmol/L (ref 135–145)

## 2020-07-23 LAB — CBC
HCT: 42.2 % (ref 39.0–52.0)
Hemoglobin: 14.9 g/dL (ref 13.0–17.0)
MCH: 33.7 pg (ref 26.0–34.0)
MCHC: 35.3 g/dL (ref 30.0–36.0)
MCV: 95.5 fL (ref 80.0–100.0)
Platelets: 228 10*3/uL (ref 150–400)
RBC: 4.42 MIL/uL (ref 4.22–5.81)
RDW: 15.6 % — ABNORMAL HIGH (ref 11.5–15.5)
WBC: 6.3 10*3/uL (ref 4.0–10.5)
nRBC: 0 % (ref 0.0–0.2)

## 2020-07-23 NOTE — Progress Notes (Signed)
COVID Vaccine Completed:yes Date COVID Vaccine completed:06/13/19-booster 02/25/20 COVID vaccine manufacturer: Pfizer     PCP - Dr. Johny Sax Cardiologist - no  Chest x-ray - 02/13/20-epic EKG - 07/23/20-chart, epic Stress Test - no ECHO - no Cardiac Cath - no Pacemaker/ICD device last checked:NA  Sleep Study - yes CPAP - yes-pressure setting 8  Fasting Blood Sugar - NA Checks Blood Sugar _____ times a day  Blood Thinner Instructions:NA Aspirin Instructions: Last Dose:  Anesthesia review:   Patient denies shortness of breath, fever, cough and chest pain at PAT appointment yes  Patient verbalized understanding of instructions that were given to them at the PAT appointment. Patient was also instructed that they will need to review over the PAT instructions again at home before surgery.yes Pt has no SOB with any activities. He takes Fasenra injections every 8 weeks to prevent wheezing. He has not needed an inhaler.

## 2020-07-26 ENCOUNTER — Encounter (HOSPITAL_COMMUNITY): Payer: Self-pay | Admitting: Surgery

## 2020-07-26 DIAGNOSIS — M7989 Other specified soft tissue disorders: Secondary | ICD-10-CM | POA: Diagnosis present

## 2020-07-26 NOTE — H&P (Signed)
General Surgery [facility]  Jerry Mcgrath DOB: 01/31/1956 Married / Language: English / Race: White Male   History of Present Illness   The patient is a 65 year old male who presents with a soft tissue mass.  CHIEF COMPLAINT: soft tissue mass left posterior neck and shoulder  Patient is referred by Dr. 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 This is been present for at least 2 years and gradually increasing in size. Patient feels like this is impacting his range of motion with his neck and shoulder. Patient underwent a CT scan on February 13, 2020. This showed a soft tissue mass measuring 2.7 x 5.0 x 11.0 cm in the left upper chest the muscle layer consistent with a benign lipoma. Patient was seen in our practice by one of my partners. He was referred initially to orthopedic surgery and then referred back to our practice for further evaluation and management. The CT scan shows a rather large elongated soft tissue mass which is deep to the trapezius muscle in the left shoulder. Patient would like to proceed with surgical excision. He has had a previous procedure performed at that site in the posterior left neck by his dermatologist and they removed a lipoma. That may have been a portion of this mass.   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 MASS OF SOFT TISSUE OF NECK (M79.89)   Past Surgical History  Oral Surgery  Vasectomy   Diagnostic Studies History  Colonoscopy  1-5 years ago  Allergies  Grass Pollen(K-O-R-[id number]-Swt Vern) *ALLERGENIC EXTRACTS/BIOLOGICALS MISC*  Pollen Extracts *ALTERNATIVE MEDICINES*  Allergies Reconciled   Medication History  Benralizumab (30MG /ML Soln Auto-inj, Subcutaneous) Active. Atorvastatin Calcium (20MG  Tablet, Oral) Active. Medications Reconciled  Social  History Alcohol use  Moderate alcohol use. Caffeine use  Carbonated beverages, Coffee. No drug use  Tobacco use  Former smoker.  Family History Heart Disease  Father.  Other Problems  Asthma  Hemorrhoids  Sleep Apnea   Vitals  Weight: 261.13 lb Height: 70in Body Surface Area: 2.34 m Body Mass Index: 37.47 kg/m  Temp.: 7F  Pulse: 94 (Regular)  P.OX: 96% (Room air)  Physical Exam  GENERAL APPEARANCE Development: normal Nutritional status: normal Gross deformities: none  SKIN Rash, lesions, ulcers: none Induration, erythema: none Nodules: none palpable  EYES Conjunctiva and lids: normal Pupils: equal and reactive Iris: normal bilaterally  EARS, NOSE, MOUTH, THROAT External ears: no lesion or deformity External nose: no lesion or deformity Hearing: grossly normal Due to Covid-19 pandemic, patient is wearing a mask.  NECK Symmetric: yes Trachea: midline Thyroid: no palpable nodules in the thyroid bed In the posterior aspect of the left neck is a well-healed surgical incision measuring approximately 3 cm in length. Deep to this is a palpable soft tissue mass measuring approximately 5 cm in width and 2-3 cm in depth. It extends beneath the edge of the left trapezius muscle. Palpation over the left shoulder allows for moving the mass beneath the muscle over a total distance of approximately 10-12 cm. There is no significant tenderness.  CHEST Respiratory effort: normal Retraction or accessory muscle use: no Breath sounds: normal bilaterally Rales, rhonchi, wheeze: none  CARDIOVASCULAR Auscultation: regular rhythm, normal rate Murmurs: none Pulses: radial pulse 2+ palpable Lower extremity edema: none  MUSCULOSKELETAL Station and gait: normal Digits and nails: no clubbing or  cyanosis Muscle strength: grossly normal all extremities Range of motion: grossly normal all extremities Deformity: none  LYMPHATIC Cervical: none  palpable Supraclavicular: none palpable  PSYCHIATRIC Oriented to person, place, and time: yes Mood and affect: normal for situation Judgment and insight: appropriate for situation    Assessment & Plan   MASS OF SOFT TISSUE OF NECK (M79.89)  Patient is referred by his primary care physician for surgical evaluation and management of a 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 This is been present for approximately 2 years and gradually increasing in size. Patient has had a previous procedure performed on this mass with probable incomplete excision.  I have recommended proceeding with excision under general anesthesia. I believe the entire mass can be removed by an incision at the base of the left neck. We discussed risk and benefits of the surgery including the possibility of nerve injury resulting in shoulder weakness or discomfort. The spinal accessory nerve does run in this general area. We discussed the hospital stay and his postoperative recovery. This would be an outpatient surgical procedure. Patient understands and wishes to proceed with surgery in the near future.  The risks and benefits of the procedure have been discussed at length with the patient. The patient understands the proposed procedure, potential alternative treatments, and the course of recovery to be expected. All of the patient's questions have been answered at this time. The patient wishes to proceed with surgery.  Armandina Gemma, MD [facility] Office: [phone]

## 2020-07-28 ENCOUNTER — Other Ambulatory Visit (HOSPITAL_COMMUNITY)
Admission: RE | Admit: 2020-07-28 | Discharge: 2020-07-28 | Disposition: A | Discharge status: Home / Self Care | Payer: BC Managed Care – PPO | Source: Ambulatory Visit | Attending: Surgery | Admitting: Surgery

## 2020-07-28 DIAGNOSIS — Z8249 Family history of ischemic heart disease and other diseases of the circulatory system: Secondary | ICD-10-CM | POA: Diagnosis not present

## 2020-07-28 DIAGNOSIS — Z9109 Other allergy status, other than to drugs and biological substances: Secondary | ICD-10-CM | POA: Diagnosis not present

## 2020-07-28 DIAGNOSIS — D170 Benign lipomatous neoplasm of skin and subcutaneous tissue of head, face and neck: Secondary | ICD-10-CM | POA: Diagnosis not present

## 2020-07-28 DIAGNOSIS — Z20822 Contact with and (suspected) exposure to covid-19: Secondary | ICD-10-CM | POA: Insufficient documentation

## 2020-07-28 DIAGNOSIS — R221 Localized swelling, mass and lump, neck: Secondary | ICD-10-CM | POA: Diagnosis not present

## 2020-07-28 DIAGNOSIS — Z87891 Personal history of nicotine dependence: Secondary | ICD-10-CM | POA: Diagnosis not present

## 2020-07-28 DIAGNOSIS — Z01812 Encounter for preprocedural laboratory examination: Secondary | ICD-10-CM | POA: Insufficient documentation

## 2020-07-28 DIAGNOSIS — D171 Benign lipomatous neoplasm of skin and subcutaneous tissue of trunk: Secondary | ICD-10-CM | POA: Diagnosis not present

## 2020-07-28 LAB — SARS CORONAVIRUS 2 (TAT 6-24 HRS): SARS Coronavirus 2: NEGATIVE

## 2020-07-30 NOTE — Anesthesia Preprocedure Evaluation (Addendum)
Anesthesia Evaluation  Patient identified by MRN, date of birth, ID band Patient awake    Reviewed: Allergy & Precautions, NPO status , Patient's Chart, lab work & pertinent test results  History of Anesthesia Complications Negative for: history of anesthetic complications  Airway Mallampati: II  TM Distance: >3 FB Neck ROM: Full    Dental  (+) Teeth Intact   Pulmonary asthma , sleep apnea , former smoker,    Pulmonary exam normal        Cardiovascular hypertension, Normal cardiovascular exam     Neuro/Psych negative neurological ROS     GI/Hepatic negative GI ROS, Neg liver ROS,   Endo/Other  negative endocrine ROS  Renal/GU negative Renal ROS  negative genitourinary   Musculoskeletal  (+) Arthritis  (gout),   Abdominal   Peds  Hematology negative hematology ROS (+)   Anesthesia Other Findings   Reproductive/Obstetrics                            Anesthesia Physical Anesthesia Plan  ASA: II  Anesthesia Plan: General   Post-op Pain Management:    Induction: Intravenous  PONV Risk Score and Plan: 2 and Ondansetron, Dexamethasone, Treatment may vary due to age or medical condition and Midazolam  Airway Management Planned: Oral ETT  Additional Equipment: None  Intra-op Plan:   Post-operative Plan: Extubation in OR  Informed Consent: I have reviewed the patients History and Physical, chart, labs and discussed the procedure including the risks, benefits and alternatives for the proposed anesthesia with the patient or authorized representative who has indicated his/her understanding and acceptance.     Dental advisory given  Plan Discussed with:   Anesthesia Plan Comments:        Anesthesia Quick Evaluation

## 2020-07-31 ENCOUNTER — Encounter (HOSPITAL_COMMUNITY): Payer: Self-pay | Admitting: Surgery

## 2020-07-31 ENCOUNTER — Ambulatory Visit (HOSPITAL_COMMUNITY): Payer: BC Managed Care – PPO | Admitting: Anesthesiology

## 2020-07-31 ENCOUNTER — Ambulatory Visit (HOSPITAL_COMMUNITY)
Admission: RE | Admit: 2020-07-31 | Discharge: 2020-07-31 | Disposition: A | Discharge status: Home / Self Care | Payer: BC Managed Care – PPO | Attending: Surgery | Admitting: Surgery

## 2020-07-31 ENCOUNTER — Encounter (HOSPITAL_COMMUNITY)
Admission: RE | Disposition: A | Discharge status: Home / Self Care | Payer: Self-pay | Source: Home / Self Care | Attending: Surgery

## 2020-07-31 DIAGNOSIS — Z20822 Contact with and (suspected) exposure to covid-19: Secondary | ICD-10-CM | POA: Diagnosis not present

## 2020-07-31 DIAGNOSIS — Z9109 Other allergy status, other than to drugs and biological substances: Secondary | ICD-10-CM | POA: Insufficient documentation

## 2020-07-31 DIAGNOSIS — D170 Benign lipomatous neoplasm of skin and subcutaneous tissue of head, face and neck: Secondary | ICD-10-CM | POA: Insufficient documentation

## 2020-07-31 DIAGNOSIS — Z87891 Personal history of nicotine dependence: Secondary | ICD-10-CM | POA: Insufficient documentation

## 2020-07-31 DIAGNOSIS — D171 Benign lipomatous neoplasm of skin and subcutaneous tissue of trunk: Secondary | ICD-10-CM | POA: Insufficient documentation

## 2020-07-31 DIAGNOSIS — Z8249 Family history of ischemic heart disease and other diseases of the circulatory system: Secondary | ICD-10-CM | POA: Insufficient documentation

## 2020-07-31 DIAGNOSIS — M7989 Other specified soft tissue disorders: Secondary | ICD-10-CM | POA: Diagnosis present

## 2020-07-31 DIAGNOSIS — E785 Hyperlipidemia, unspecified: Secondary | ICD-10-CM | POA: Diagnosis not present

## 2020-07-31 DIAGNOSIS — R221 Localized swelling, mass and lump, neck: Secondary | ICD-10-CM | POA: Diagnosis not present

## 2020-07-31 DIAGNOSIS — R2232 Localized swelling, mass and lump, left upper limb: Secondary | ICD-10-CM | POA: Diagnosis not present

## 2020-07-31 DIAGNOSIS — I10 Essential (primary) hypertension: Secondary | ICD-10-CM | POA: Diagnosis not present

## 2020-07-31 HISTORY — PX: MASS EXCISION: SHX2000

## 2020-07-31 SURGERY — EXCISION MASS
Anesthesia: General | Laterality: Left

## 2020-07-31 MED ORDER — DEXAMETHASONE SODIUM PHOSPHATE 10 MG/ML IJ SOLN
INTRAMUSCULAR | Status: AC
  Filled 2020-07-31: qty 1

## 2020-07-31 MED ORDER — ROCURONIUM BROMIDE 10 MG/ML (PF) SYRINGE
PREFILLED_SYRINGE | INTRAVENOUS | Status: AC
  Filled 2020-07-31: qty 10

## 2020-07-31 MED ORDER — CHLORHEXIDINE GLUCONATE CLOTH 2 % EX PADS: 6.0000 | MEDICATED_PAD | Freq: Once | CUTANEOUS | Status: DC

## 2020-07-31 MED ORDER — BUPIVACAINE-EPINEPHRINE 0.5% -1:200000 IJ SOLN
INTRAMUSCULAR | Status: AC
  Filled 2020-07-31: qty 1

## 2020-07-31 MED ORDER — FENTANYL CITRATE (PF) 250 MCG/5ML IJ SOLN
INTRAMUSCULAR | Status: AC
  Filled 2020-07-31: qty 5

## 2020-07-31 MED ORDER — PHENYLEPHRINE 40 MCG/ML (10ML) SYRINGE FOR IV PUSH (FOR BLOOD PRESSURE SUPPORT)
PREFILLED_SYRINGE | INTRAVENOUS | Status: AC
  Filled 2020-07-31: qty 10

## 2020-07-31 MED ORDER — LIDOCAINE 2% (20 MG/ML) 5 ML SYRINGE
INTRAMUSCULAR | Status: AC
  Filled 2020-07-31: qty 5

## 2020-07-31 MED ORDER — ONDANSETRON HCL 4 MG/2ML IJ SOLN
INTRAMUSCULAR | Status: AC
  Filled 2020-07-31: qty 2

## 2020-07-31 MED ORDER — MIDAZOLAM HCL 2 MG/2ML IJ SOLN
INTRAMUSCULAR | Status: AC
  Filled 2020-07-31: qty 2

## 2020-07-31 MED ORDER — TRAMADOL HCL 50 MG PO TABS: 50.0000 mg | ORAL_TABLET | Freq: Four times a day (QID) | ORAL | 0 refills | Status: DC | PRN

## 2020-07-31 MED ORDER — GLYCOPYRROLATE PF 0.2 MG/ML IJ SOSY
PREFILLED_SYRINGE | INTRAMUSCULAR | Status: AC
  Filled 2020-07-31: qty 1

## 2020-07-31 MED ORDER — EPHEDRINE 5 MG/ML INJ
INTRAVENOUS | Status: AC
  Filled 2020-07-31: qty 10

## 2020-07-31 MED ORDER — CEFAZOLIN SODIUM-DEXTROSE 2-4 GM/100ML-% IV SOLN
2.0000 g | INTRAVENOUS | Status: AC
  Administered 2020-07-31: 2 g via INTRAVENOUS
  Filled 2020-07-31: qty 100

## 2020-07-31 MED ORDER — OXYCODONE HCL 5 MG PO TABS: 5.0000 mg | ORAL_TABLET | Freq: Once | ORAL | Status: DC | PRN

## 2020-07-31 MED ORDER — PROPOFOL 10 MG/ML IV BOLUS
INTRAVENOUS | Status: AC
  Filled 2020-07-31: qty 40

## 2020-07-31 MED ORDER — GLYCOPYRROLATE 0.2 MG/ML IJ SOLN
INTRAMUSCULAR | Status: DC | PRN
  Administered 2020-07-31: .2 mg via INTRAVENOUS

## 2020-07-31 MED ORDER — DEXAMETHASONE SODIUM PHOSPHATE 10 MG/ML IJ SOLN
INTRAMUSCULAR | Status: DC | PRN
  Administered 2020-07-31: 10 mg via INTRAVENOUS

## 2020-07-31 MED ORDER — LIDOCAINE HCL (CARDIAC) PF 100 MG/5ML IV SOSY
PREFILLED_SYRINGE | INTRAVENOUS | Status: DC | PRN
  Administered 2020-07-31: 100 mg via INTRAVENOUS

## 2020-07-31 MED ORDER — ROCURONIUM BROMIDE 100 MG/10ML IV SOLN
INTRAVENOUS | Status: DC | PRN
  Administered 2020-07-31: 80 mg via INTRAVENOUS

## 2020-07-31 MED ORDER — FENTANYL CITRATE (PF) 100 MCG/2ML IJ SOLN
INTRAMUSCULAR | Status: DC | PRN
  Administered 2020-07-31 (×2): 100 ug via INTRAVENOUS

## 2020-07-31 MED ORDER — EPHEDRINE SULFATE 50 MG/ML IJ SOLN
INTRAMUSCULAR | Status: DC | PRN
  Administered 2020-07-31 (×2): 10 mg via INTRAVENOUS

## 2020-07-31 MED ORDER — AMISULPRIDE (ANTIEMETIC) 5 MG/2ML IV SOLN: 10.0000 mg | Freq: Once | INTRAVENOUS | Status: DC | PRN

## 2020-07-31 MED ORDER — SUCCINYLCHOLINE CHLORIDE 200 MG/10ML IV SOSY
PREFILLED_SYRINGE | INTRAVENOUS | Status: AC
  Filled 2020-07-31: qty 10

## 2020-07-31 MED ORDER — 0.9 % SODIUM CHLORIDE (POUR BTL) OPTIME
TOPICAL | Status: DC | PRN
  Administered 2020-07-31: 1000 mL

## 2020-07-31 MED ORDER — LIDOCAINE HCL (PF) 1 % IJ SOLN
INTRAMUSCULAR | Status: AC
  Filled 2020-07-31: qty 30

## 2020-07-31 MED ORDER — SUGAMMADEX SODIUM 500 MG/5ML IV SOLN
INTRAVENOUS | Status: DC | PRN
  Administered 2020-07-31: 200 mg via INTRAVENOUS

## 2020-07-31 MED ORDER — MIDAZOLAM HCL 5 MG/5ML IJ SOLN
INTRAMUSCULAR | Status: DC | PRN
  Administered 2020-07-31: 2 mg via INTRAVENOUS

## 2020-07-31 MED ORDER — BUPIVACAINE-EPINEPHRINE 0.5% -1:200000 IJ SOLN
INTRAMUSCULAR | Status: DC | PRN
  Administered 2020-07-31: 20 mL

## 2020-07-31 MED ORDER — FENTANYL CITRATE (PF) 100 MCG/2ML IJ SOLN: 25.0000 ug | INTRAMUSCULAR | Status: DC | PRN

## 2020-07-31 MED ORDER — PROPOFOL 10 MG/ML IV BOLUS
INTRAVENOUS | Status: DC | PRN
  Administered 2020-07-31: 200 mg via INTRAVENOUS

## 2020-07-31 MED ORDER — LACTATED RINGERS IV SOLN: INTRAVENOUS | Status: DC

## 2020-07-31 MED ORDER — ONDANSETRON HCL 4 MG/2ML IJ SOLN: 4.0000 mg | Freq: Once | INTRAMUSCULAR | Status: DC | PRN

## 2020-07-31 MED ORDER — CHLORHEXIDINE GLUCONATE 0.12 % MT SOLN: 15.0000 mL | Freq: Once | OROMUCOSAL | Status: AC

## 2020-07-31 MED ORDER — OXYCODONE HCL 5 MG/5ML PO SOLN: 5.0000 mg | Freq: Once | ORAL | Status: DC | PRN

## 2020-07-31 MED ORDER — ORAL CARE MOUTH RINSE
15.0000 mL | Freq: Once | OROMUCOSAL | Status: AC
  Administered 2020-07-31: 15 mL via OROMUCOSAL

## 2020-07-31 MED ORDER — ONDANSETRON HCL 4 MG/2ML IJ SOLN
INTRAMUSCULAR | Status: DC | PRN
  Administered 2020-07-31: 4 mg via INTRAVENOUS

## 2020-07-31 SURGICAL SUPPLY — 25 items
CHLORAPREP W/TINT 26 (MISCELLANEOUS) ×2 IMPLANT
COVER SURGICAL LIGHT HANDLE (MISCELLANEOUS) ×2 IMPLANT
COVER WAND RF STERILE (DRAPES) IMPLANT
DECANTER SPIKE VIAL GLASS SM (MISCELLANEOUS) IMPLANT
DRAPE LAPAROSCOPIC ABDOMINAL (DRAPES) IMPLANT
DRAPE LAPAROTOMY T 102X78X121 (DRAPES) IMPLANT
DRAPE LAPAROTOMY TRNSV 102X78 (DRAPES) IMPLANT
DRAPE SHEET LG 3/4 BI-LAMINATE (DRAPES) IMPLANT
ELECT REM PT RETURN 15FT ADLT (MISCELLANEOUS) ×2 IMPLANT
GAUZE SPONGE 4X4 12PLY STRL (GAUZE/BANDAGES/DRESSINGS) IMPLANT
GLOVE SURG ORTHO LTX SZ8 (GLOVE) ×2 IMPLANT
GLOVE SURG UNDER POLY LF SZ7 (GLOVE) ×2 IMPLANT
GOWN STRL REUS W/TWL XL LVL3 (GOWN DISPOSABLE) ×4 IMPLANT
KIT BASIN OR (CUSTOM PROCEDURE TRAY) ×2 IMPLANT
KIT TURNOVER KIT A (KITS) ×2 IMPLANT
MARKER SKIN DUAL TIP RULER LAB (MISCELLANEOUS) IMPLANT
NEEDLE HYPO 25X1 1.5 SAFETY (NEEDLE) ×2 IMPLANT
NS IRRIG 1000ML POUR BTL (IV SOLUTION) ×2 IMPLANT
PACK GENERAL/GYN (CUSTOM PROCEDURE TRAY) ×2 IMPLANT
SPONGE LAP 4X18 RFD (DISPOSABLE) IMPLANT
STAPLER VISISTAT 35W (STAPLE) IMPLANT
STRIP CLOSURE SKIN 1/2X4 (GAUZE/BANDAGES/DRESSINGS) IMPLANT
SUT VIC AB 3-0 SH 18 (SUTURE) IMPLANT
SYR CONTROL 10ML LL (SYRINGE) ×2 IMPLANT
TOWEL OR 17X26 10 PK STRL BLUE (TOWEL DISPOSABLE) ×2 IMPLANT

## 2020-07-31 NOTE — Anesthesia Postprocedure Evaluation (Signed)
Anesthesia Post Note  Patient: Jerry Mcgrath  Procedure(s) Performed: EXCISION SOFT TISSUE MASS LEFT POSTERIOR NECK AND SHOULDER (Left )     Patient location during evaluation: PACU Anesthesia Type: General Level of consciousness: awake and alert Pain management: pain level controlled Vital Signs Assessment: post-procedure vital signs reviewed and stable Respiratory status: spontaneous breathing, nonlabored ventilation and respiratory function stable Cardiovascular status: blood pressure returned to baseline and stable Postop Assessment: no apparent nausea or vomiting Anesthetic complications: no   No complications documented.  Last Vitals:  Vitals:   07/31/20 0945 07/31/20 1000  BP: (!) 145/66 (!) 143/72  Pulse: 62 64  Resp: 18 13  Temp: 36.7 C   SpO2: 96% 94%    Last Pain:  Vitals:   07/31/20 0945  TempSrc:   PainSc: 0-No pain                 Lidia Collum

## 2020-07-31 NOTE — Transfer of Care (Signed)
Immediate Anesthesia Transfer of Care Note  Patient: Jerry Mcgrath  Procedure(s) Performed: EXCISION SOFT TISSUE MASS LEFT POSTERIOR NECK AND SHOULDER (Left )  Patient Location: PACU  Anesthesia Type:General  Level of Consciousness: awake, alert , oriented and patient cooperative  Airway & Oxygen Therapy: Patient Spontanous Breathing and Patient connected to face mask oxygen  Post-op Assessment: Report given to RN, Post -op Vital signs reviewed and stable and Patient moving all extremities X 4  Post vital signs: Reviewed and stable  Last Vitals:  Vitals Value Taken Time  BP    Temp    Pulse 60 07/31/20 0930  Resp 8 07/31/20 0930  SpO2 99 % 07/31/20 0930  Vitals shown include unvalidated device data.  Last Pain:  Vitals:   07/31/20 0654  TempSrc: Oral         Complications: No complications documented.

## 2020-07-31 NOTE — Addendum Note (Signed)
Addendum  created 07/31/20 1030 by Jonna Munro, CRNA   Review and Sign - Signed

## 2020-07-31 NOTE — Op Note (Signed)
Operative Note  Pre-operative Diagnosis:  Soft tissue mass left posterior neck and shoulder  Post-operative Diagnosis:  same  Surgeon:  Armandina Gemma, MD  Assistant:  none   Procedure:  Excision of soft tissue mass left posterior neck and shoulder, submuscular, 13 x 6 x 2 cm  Anesthesia:  general  Estimated Blood Loss:  minimal  Drains: none         Specimen: to pathology  Indications:  Patient is referred by Dr. 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 This is been present for at least 2 years and gradually increasing in size. Patient feels like this is impacting his range of motion with his neck and shoulder. Patient underwent a CT scan on February 13, 2020. This showed a soft tissue mass measuring 2.7 x 5.0 x 11.0 cm in the left upper chest the muscle layer consistent with a benign lipoma. Patient was seen in our practice by one of my partners. He was referred initially to orthopedic surgery and then referred back to our practice for further evaluation and management. The CT scan shows a rather large elongated soft tissue mass which is deep to the trapezius muscle in the left shoulder. Patient would like to proceed with surgical excision. He has had a previous procedure performed at that site in the posterior left neck by his dermatologist and they removed a lipoma. That may have been a portion of this mass.  Procedure:  The patient was seen in the pre-op holding area. The risks, benefits, complications, treatment options, and expected outcomes were previously discussed with the patient. The patient agreed with the proposed plan and has signed the informed consent form.  The patient was brought to the operating room by the surgical team, identified as Jerry Mcgrath and the procedure verified. A "time out" was completed and the above information confirmed.  Following induction of general  anesthesia, the patient is placed in a right lateral decubitus position, positioned properly, and then prepped and draped in the usual aseptic fashion.  After ascertaining that an adequate level of anesthesia been achieved, an incision is made over the left posterior neck near the site of the previous surgical wound.  Dissection is carried through subcutaneous tissue and scar tissue.  The anterior edge of the trapezius muscle was identified.  This was mobilized over a few centimeters until the soft tissue mass was identified slightly anterior to the trapezius.  Using a combination of gentle blunt dissection and judicious use of the electrocautery, the superior aspect of the soft tissue mass is mobilized.  This allows for gentle blunt dissection in the submuscular plane extending caudad medial and posterior to the shoulder.  Mass is completely mobilized and delivered up and into the wound.  Vascular pedicle was divided with the electrocautery.  The entire mass was excised.  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  It appears somewhat encapsulated.  It measures 13 cm x 6 cm x 2 cm in size.  It is submitted in its entirety to pathology for review.  The wound is inspected for good hemostasis throughout.  Musculature returns naturally to its normal anatomic position.  Subcutaneous tissues are closed with interrupted 3-0 Vicryl sutures.  Skin is anesthetized with local anesthetic.  Skin edges are reapproximated with a running 3-0 Monocryl subcuticular suture.  Wound is washed and dried and Dermabond is applied as dressing.  Patient is awakened from anesthesia and  transported to the recovery room.  The patient tolerated the procedure well.   Armandina Gemma, MD [facility] Office: [phone]

## 2020-07-31 NOTE — Interval H&P Note (Signed)
History and Physical Interval Note:  07/31/2020 8:02 AM  Jerry Mcgrath  has presented today for surgery, with the diagnosis of SOFT TISSUE MASS LEFT POSERIOR NECK AND SHOULDER.  The various methods of treatment have been discussed with the patient and family. After consideration of risks, benefits and other options for treatment, the patient has consented to    Procedure(s): EXCISION SOFT TISSUE MASS LEFT POSTERIOR NECK AND SHOULDER (Left) as a surgical intervention.    The patient's history has been reviewed, patient examined, no change in status, stable for surgery.  I have reviewed the patient's chart and labs.  Questions were answered to the patient's satisfaction.    Armandina Gemma, MD [facility] Office: [place]

## 2020-07-31 NOTE — Anesthesia Procedure Notes (Signed)
Procedure Name: Intubation Date/Time: 07/31/2020 8:23 AM Performed by: Jonna Munro, CRNA Pre-anesthesia Checklist: Patient identified, Emergency Drugs available, Suction available, Patient being monitored and Timeout performed Patient Re-evaluated:Patient Re-evaluated prior to induction Oxygen Delivery Method: Circle system utilized Preoxygenation: Pre-oxygenation with 100% oxygen Induction Type: IV induction Ventilation: Nasal airway inserted- appropriate to patient size Laryngoscope Size: Mac and 4 Grade View: Grade I Tube type: Oral Tube size: 7.5 mm Number of attempts: 1 Airway Equipment and Method: Stylet Placement Confirmation: ETT inserted through vocal cords under direct vision,  positive ETCO2 and breath sounds checked- equal and bilateral Secured at: 24 cm Tube secured with: Tape Dental Injury: Teeth and Oropharynx as per pre-operative assessment

## 2020-07-31 NOTE — Discharge Instructions (Signed)
  [facility] -- DISCHARGE INSTRUCTIONS  REMINDER:   Carry a list of your medications and allergies with you at all times  Call your pharmacy at least 1 week in advance to refill prescriptions  Do not mix any prescribed pain medicine with alcohol  Do not drive any motor vehicles while taking pain medication  Take medications with food unless otherwise directed  Follow-up appointments (date to return to physician): Please call [phone] to confirm your follow up appointment with your surgeon.  Call your Surgeon if you have:  Temperature greater than 101.0  Persistent nausea and vomiting  Severe uncontrolled pain  Redness, tenderness, or signs of infection (pain, swelling, redness, odor or green/yellow discharge around the site)  Difficulty breathing, headache or visual disturbances  Hives  Persistent dizziness or light-headedness  Any other questions or concerns you may have after discharge  In an emergency, call 911 or go to an Emergency Department at a nearby hospital.  Diet: Begin with liquids, and if they are tolerated, resume your usual diet.  Avoid spicy, greasy or heavy foods.  If you have nausea or vomiting, go back to liquids.  If you cannot keep liquids down, call your doctor.  Avoid alcohol consumption while on prescription pain medications. Good nutrition promotes healing. Increase fiber and fluids.   ADDITIONAL INSTRUCTIONS: Leave Dermabond in place 7-10 days.  May shower.  Use ice packs to wound for first 48 hours and then as needed.  [facility] Office: [phone]

## 2020-08-01 ENCOUNTER — Encounter (HOSPITAL_COMMUNITY): Payer: Self-pay | Admitting: Surgery

## 2020-08-01 LAB — SURGICAL PATHOLOGY

## 2020-08-01 NOTE — Progress Notes (Signed)
Please contact patient and notify of benign pathology results.  Valery Amedee M. Aizlynn Digilio, MD, FACS [facility] Office: [phone]   

## 2020-11-04 ENCOUNTER — Ambulatory Visit (INDEPENDENT_AMBULATORY_CARE_PROVIDER_SITE_OTHER): Payer: BC Managed Care – PPO | Admitting: Allergy and Immunology

## 2020-11-04 ENCOUNTER — Other Ambulatory Visit: Payer: Self-pay

## 2020-11-04 VITALS — BP 140/86 | HR 67 | Temp 97.9°F | Resp 18 | Ht 70.0 in | Wt 250.0 lb

## 2020-11-04 DIAGNOSIS — J4550 Severe persistent asthma, uncomplicated: Secondary | ICD-10-CM

## 2020-11-04 DIAGNOSIS — J3089 Other allergic rhinitis: Secondary | ICD-10-CM | POA: Diagnosis not present

## 2020-11-04 MED ORDER — CETIRIZINE HCL 10 MG PO TABS: 10.0000 mg | ORAL_TABLET | Freq: Every day | ORAL | 11 refills | Status: DC

## 2020-11-04 MED ORDER — EPINEPHRINE 0.3 MG/0.3ML IJ SOAJ: INTRAMUSCULAR | 2 refills | Status: DC

## 2020-11-04 MED ORDER — TRIAMCINOLONE ACETONIDE 55 MCG/ACT NA AERO: 2.0000 | INHALATION_SPRAY | Freq: Every day | NASAL | 11 refills | Status: DC

## 2020-11-04 MED ORDER — ALBUTEROL SULFATE HFA 108 (90 BASE) MCG/ACT IN AERS: 2.0000 | INHALATION_SPRAY | Freq: Four times a day (QID) | RESPIRATORY_TRACT | 2 refills | Status: AC | PRN

## 2020-11-04 MED ORDER — MONTELUKAST SODIUM 10 MG PO TABS: 10.0000 mg | ORAL_TABLET | Freq: Every day | ORAL | 11 refills | Status: DC

## 2020-11-04 NOTE — Patient Instructions (Addendum)
  1.  Continue benralizumab AUTOINJECTOR    2. If needed:    A.  ProAir HFA 2 puffs every 4-6 hours if needed  B.  Nasal saline  C.  OTC Mucinex DM 1-2 tablets 1-2 times per day  D.  Over-the-counter Zyrtec 10 mg 1 tablet once a day  E.  Montelukast 10 mg - 1 tablet 1 time per day  F.  OTC Nasacort  3. Obtain fall flu vaccine    4. Return to clinic in 12 months or earlier if problem

## 2020-11-04 NOTE — Progress Notes (Signed)
[place] - [place] - [place]   Follow-up Note  Referring Provider: Ginger Organ., MD Primary Provider: Ginger Organ., MD Date of Office Visit: 11/04/2020  Subjective:   Jerry Mcgrath (DOB: Jul 19, 1955) is a 65 y.o. 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  His last visit to this clinic was 27 November 2019.  Overall he continues to do well with his airway and has had very little problems involving his asthma can exert himself without any difficulty and can have cold air exposure without any difficulty and rarely uses a short acting bronchodilator while he continues on benralizumab injections.  Likewise, he has had done very well with his upper airway issue though occasionally does have some sneezing.  He does not really use any nasal steroid on a consistent basis but occasionally will grab a over-the-counter Nasacort.  He does continue on montelukast on a pretty regular basis.  He has received 3 COVID vaccinations  Allergies as of 11/04/2020   No Known Allergies      Medication List    albuterol 108 (90 Base) MCG/ACT inhaler Commonly known as: VENTOLIN HFA Inhale 2 puffs into the lungs every 6 (six) hours as needed for wheezing or shortness of breath.   atorvastatin 20 MG tablet Commonly known as: LIPITOR Take 20 mg by mouth daily.   Fasenra Pen 30 MG/ML Soaj Generic drug: Benralizumab Inject 30 mg into the skin every 8 (eight) weeks.   montelukast 10 MG tablet Commonly known as: SINGULAIR Take 1 tablet (10 mg total) by mouth at bedtime.   traMADol 50 MG tablet Commonly known as: ULTRAM Take 1-2 tablets (50-100 mg total) by mouth every 6 (six) hours as needed for moderate pain.        Past Medical History:  Diagnosis Date   Asthma    triggers by seasonal  changes   Colonic polyp    Gout    Hyperlipemia    Hypertension    IFG (impaired fasting glucose)    Obesity    OSA (obstructive sleep apnea)     Past Surgical History:  Procedure Laterality Date   MASS EXCISION Left 07/31/2020   Procedure: EXCISION SOFT TISSUE MASS LEFT POSTERIOR NECK AND SHOULDER;  Surgeon: Armandina Gemma, MD;  Location: [facility];  Service: General;  Laterality: Left;   WISDOM TOOTH EXTRACTION      Review of systems negative except as noted in HPI / PMHx or noted below:  Review of Systems  Constitutional: Negative.   HENT: Negative.    Eyes: Negative.   Respiratory: Negative.    Cardiovascular: Negative.   Gastrointestinal: Negative.   Genitourinary: Negative.   Musculoskeletal: Negative.   Skin: Negative.   Neurological: Negative.   Endo/Heme/Allergies: Negative.   Psychiatric/Behavioral: Negative.      Objective:   Vitals:   11/05/20 1859  BP: 140/86  Pulse: 67  Resp: 18  Temp: 97.9 F (36.6 C)  SpO2: 97%   Height: '5\' 10"'$  (177.8 cm)  Weight: 250 lb (113.4 kg)   Physical Exam Constitutional:      Appearance: He is not diaphoretic.  HENT:     Head: Normocephalic.     Right Ear: Tympanic membrane, ear canal and external ear normal.     Left Ear: Tympanic membrane, ear canal and  external ear normal.     Nose: Nose normal. No mucosal edema or rhinorrhea.     Mouth/Throat:     Pharynx: Uvula midline. No oropharyngeal exudate.  Eyes:     Conjunctiva/sclera: Conjunctivae normal.  Neck:     Thyroid: No thyromegaly.     Trachea: Trachea normal. No tracheal tenderness or tracheal deviation.  Cardiovascular:     Rate and Rhythm: Normal rate and regular rhythm.     Heart sounds: Normal heart sounds, S1 normal and S2 normal. No murmur heard. Pulmonary:     Effort: No respiratory distress.     Breath sounds: Normal breath sounds. No stridor. No wheezing or rales.  Lymphadenopathy:     Head:     Right side of head: No tonsillar adenopathy.      Left side of head: No tonsillar adenopathy.     Cervical: No cervical adenopathy.  Skin:    Findings: No erythema or rash.     Nails: There is no clubbing.  Neurological:     Mental Status: He is alert.    Diagnostics:    Spirometry was performed and demonstrated an FEV1 of 2.12 at 64 % of predicted.  Assessment and Plan:   1. Asthma, severe persistent, well-controlled   2. Other allergic rhinitis     1.  Continue benralizumab AUTOINJECTOR    2. If needed:    A.  ProAir HFA 2 puffs every 4-6 hours if needed  B.  Nasal saline  C.  OTC Mucinex DM 1-2 tablets 1-2 times per day  D.  Over-the-counter Zyrtec 10 mg 1 tablet once a day  E.  Montelukast 10 mg - 1 tablet 1 time per day  F.  OTC Nasacort  3. Obtain fall flu vaccine    4. Return to clinic in 12 months or earlier if problem  Jerry Mcgrath is doing very well on his current therapy which includes benralizumab.  He has a selection of other agents that he can utilize should he find that he require more anti-inflammatory agents for his upper airway and symptomatic medications as noted above.  He does have some issue about using benralizumab on a long-term basis and I informed him that he is certainly welcome to discontinue this medication and follow what happens over the course of the next several months and should he find that he has worsening control of his respiratory tract issue then he can certainly restart this medication.  Allena Katz, MD Allergy / Immunology [place]

## 2020-11-05 ENCOUNTER — Encounter: Payer: Self-pay | Admitting: Allergy and Immunology

## 2020-11-26 ENCOUNTER — Encounter: Payer: Self-pay | Admitting: Allergy and Immunology

## 2020-12-03 ENCOUNTER — Other Ambulatory Visit: Payer: Self-pay | Admitting: Allergy and Immunology

## 2021-01-09 DIAGNOSIS — M9901 Segmental and somatic dysfunction of cervical region: Secondary | ICD-10-CM | POA: Diagnosis not present

## 2021-01-09 DIAGNOSIS — M9902 Segmental and somatic dysfunction of thoracic region: Secondary | ICD-10-CM | POA: Diagnosis not present

## 2021-01-09 DIAGNOSIS — M5032 Other cervical disc degeneration, mid-cervical region, unspecified level: Secondary | ICD-10-CM | POA: Diagnosis not present

## 2021-01-09 DIAGNOSIS — M791 Myalgia, unspecified site: Secondary | ICD-10-CM | POA: Diagnosis not present

## 2021-01-14 DIAGNOSIS — M9901 Segmental and somatic dysfunction of cervical region: Secondary | ICD-10-CM | POA: Diagnosis not present

## 2021-01-14 DIAGNOSIS — M9902 Segmental and somatic dysfunction of thoracic region: Secondary | ICD-10-CM | POA: Diagnosis not present

## 2021-01-14 DIAGNOSIS — M791 Myalgia, unspecified site: Secondary | ICD-10-CM | POA: Diagnosis not present

## 2021-01-14 DIAGNOSIS — M5032 Other cervical disc degeneration, mid-cervical region, unspecified level: Secondary | ICD-10-CM | POA: Diagnosis not present

## 2021-01-17 DIAGNOSIS — Z23 Encounter for immunization: Secondary | ICD-10-CM | POA: Diagnosis not present

## 2021-01-22 DIAGNOSIS — M791 Myalgia, unspecified site: Secondary | ICD-10-CM | POA: Diagnosis not present

## 2021-01-22 DIAGNOSIS — M9902 Segmental and somatic dysfunction of thoracic region: Secondary | ICD-10-CM | POA: Diagnosis not present

## 2021-01-22 DIAGNOSIS — M5032 Other cervical disc degeneration, mid-cervical region, unspecified level: Secondary | ICD-10-CM | POA: Diagnosis not present

## 2021-01-22 DIAGNOSIS — M9901 Segmental and somatic dysfunction of cervical region: Secondary | ICD-10-CM | POA: Diagnosis not present

## 2021-01-29 DIAGNOSIS — Z125 Encounter for screening for malignant neoplasm of prostate: Secondary | ICD-10-CM | POA: Diagnosis not present

## 2021-01-29 DIAGNOSIS — M109 Gout, unspecified: Secondary | ICD-10-CM | POA: Diagnosis not present

## 2021-01-29 DIAGNOSIS — M9902 Segmental and somatic dysfunction of thoracic region: Secondary | ICD-10-CM | POA: Diagnosis not present

## 2021-01-29 DIAGNOSIS — M5032 Other cervical disc degeneration, mid-cervical region, unspecified level: Secondary | ICD-10-CM | POA: Diagnosis not present

## 2021-01-29 DIAGNOSIS — E785 Hyperlipidemia, unspecified: Secondary | ICD-10-CM | POA: Diagnosis not present

## 2021-01-29 DIAGNOSIS — E291 Testicular hypofunction: Secondary | ICD-10-CM | POA: Diagnosis not present

## 2021-01-29 DIAGNOSIS — M9901 Segmental and somatic dysfunction of cervical region: Secondary | ICD-10-CM | POA: Diagnosis not present

## 2021-01-29 DIAGNOSIS — M791 Myalgia, unspecified site: Secondary | ICD-10-CM | POA: Diagnosis not present

## 2021-01-29 DIAGNOSIS — R7301 Impaired fasting glucose: Secondary | ICD-10-CM | POA: Diagnosis not present

## 2021-02-13 DIAGNOSIS — Z0000 Encounter for general adult medical examination without abnormal findings: Secondary | ICD-10-CM | POA: Diagnosis not present

## 2021-02-13 DIAGNOSIS — Z1331 Encounter for screening for depression: Secondary | ICD-10-CM | POA: Diagnosis not present

## 2021-02-13 DIAGNOSIS — Z1339 Encounter for screening examination for other mental health and behavioral disorders: Secondary | ICD-10-CM | POA: Diagnosis not present

## 2021-02-13 DIAGNOSIS — I10 Essential (primary) hypertension: Secondary | ICD-10-CM | POA: Diagnosis not present

## 2021-02-13 DIAGNOSIS — R82998 Other abnormal findings in urine: Secondary | ICD-10-CM | POA: Diagnosis not present

## 2021-02-16 ENCOUNTER — Other Ambulatory Visit: Payer: Self-pay | Admitting: Internal Medicine

## 2021-02-16 DIAGNOSIS — F17211 Nicotine dependence, cigarettes, in remission: Secondary | ICD-10-CM

## 2021-02-27 DIAGNOSIS — M9902 Segmental and somatic dysfunction of thoracic region: Secondary | ICD-10-CM | POA: Diagnosis not present

## 2021-02-27 DIAGNOSIS — M9903 Segmental and somatic dysfunction of lumbar region: Secondary | ICD-10-CM | POA: Diagnosis not present

## 2021-02-27 DIAGNOSIS — M5032 Other cervical disc degeneration, mid-cervical region, unspecified level: Secondary | ICD-10-CM | POA: Diagnosis not present

## 2021-02-27 DIAGNOSIS — M9901 Segmental and somatic dysfunction of cervical region: Secondary | ICD-10-CM | POA: Diagnosis not present

## 2021-03-04 DIAGNOSIS — M5032 Other cervical disc degeneration, mid-cervical region, unspecified level: Secondary | ICD-10-CM | POA: Diagnosis not present

## 2021-03-04 DIAGNOSIS — M9903 Segmental and somatic dysfunction of lumbar region: Secondary | ICD-10-CM | POA: Diagnosis not present

## 2021-03-04 DIAGNOSIS — M9901 Segmental and somatic dysfunction of cervical region: Secondary | ICD-10-CM | POA: Diagnosis not present

## 2021-03-04 DIAGNOSIS — M9902 Segmental and somatic dysfunction of thoracic region: Secondary | ICD-10-CM | POA: Diagnosis not present

## 2021-03-16 ENCOUNTER — Ambulatory Visit
Admission: RE | Admit: 2021-03-16 | Discharge: 2021-03-16 | Disposition: A | Discharge status: Home / Self Care | Payer: BC Managed Care – PPO | Source: Ambulatory Visit | Attending: Internal Medicine | Admitting: Internal Medicine

## 2021-03-16 DIAGNOSIS — F17211 Nicotine dependence, cigarettes, in remission: Secondary | ICD-10-CM

## 2021-03-16 DIAGNOSIS — Z87891 Personal history of nicotine dependence: Secondary | ICD-10-CM | POA: Diagnosis not present

## 2021-04-29 ENCOUNTER — Telehealth: Payer: Self-pay | Admitting: Adult Health

## 2021-04-29 NOTE — Telephone Encounter (Signed)
No we don't have any data since April 2022. Need to DL from card.

## 2021-04-29 NOTE — Telephone Encounter (Signed)
Called pt to confirm VV on 1/18, during call he stated that his CPAP machine was no longer uploading info and wanted to see if we were able to still pull his information. Please advise.

## 2021-04-29 NOTE — Telephone Encounter (Signed)
Spoke with pt. He will bring his SD card to our office for the download prior to this appt. Pt aware of office hours.

## 2021-05-05 ENCOUNTER — Encounter: Payer: BC Managed Care – PPO | Admitting: Adult Health

## 2021-05-12 NOTE — Progress Notes (Signed)
This encounter was created in error - please disregard.

## 2021-10-06 ENCOUNTER — Encounter: Payer: Self-pay | Admitting: *Deleted

## 2021-10-07 DIAGNOSIS — G4733 Obstructive sleep apnea (adult) (pediatric): Secondary | ICD-10-CM | POA: Diagnosis not present

## 2021-10-27 ENCOUNTER — Telehealth: Payer: Self-pay | Admitting: *Deleted

## 2021-10-27 ENCOUNTER — Ambulatory Visit (INDEPENDENT_AMBULATORY_CARE_PROVIDER_SITE_OTHER): Payer: BC Managed Care – PPO | Admitting: Allergy and Immunology

## 2021-10-27 ENCOUNTER — Encounter: Payer: Self-pay | Admitting: Allergy and Immunology

## 2021-10-27 VITALS — BP 130/80 | HR 54 | Temp 97.9°F | Resp 18 | Ht 70.0 in | Wt 268.8 lb

## 2021-10-27 DIAGNOSIS — J4550 Severe persistent asthma, uncomplicated: Secondary | ICD-10-CM

## 2021-10-27 DIAGNOSIS — J3089 Other allergic rhinitis: Secondary | ICD-10-CM

## 2021-10-27 MED ORDER — AUVI-Q 0.3 MG/0.3ML IJ SOAJ: INTRAMUSCULAR | 2 refills | Status: AC

## 2021-10-27 MED ORDER — CETIRIZINE HCL 10 MG PO TABS: 10.0000 mg | ORAL_TABLET | Freq: Every day | ORAL | 11 refills | Status: AC

## 2021-10-27 MED ORDER — BENRALIZUMAB 30 MG/ML ~~LOC~~ SOSY
30.0000 mg | PREFILLED_SYRINGE | Freq: Once | SUBCUTANEOUS | Status: AC
  Administered 2021-10-27: 30 mg via SUBCUTANEOUS

## 2021-10-27 MED ORDER — TRIAMCINOLONE ACETONIDE 55 MCG/ACT NA AERO: 2.0000 | INHALATION_SPRAY | Freq: Every day | NASAL | 11 refills | Status: AC

## 2021-10-27 MED ORDER — MONTELUKAST SODIUM 10 MG PO TABS: 10.0000 mg | ORAL_TABLET | Freq: Every day | ORAL | 5 refills | Status: DC

## 2021-10-27 NOTE — Telephone Encounter (Signed)
Patient off therapy and will need new CBC w/diff unfortunately you gave him sample today. Please advise?

## 2021-10-27 NOTE — Progress Notes (Unsigned)
Immunotherapy   Patient Details  Name: DENY CHEVEZ MRN: [id number] Date of Birth: May 02, 1955  10/27/2021  [name] started injections for  Fasenra  Frequency: Every 4 Weeks x3, then every 8 weeks  Epi-Pen:Epi-Pen Available  Consent signed and patient instructions given. Patient restarted [name] today and received '30mg'$  in the [place]. Patient did not experience any issues.    Carolynn Tuley Fernandez-Vernon 10/27/2021, 11:32 AM

## 2021-10-27 NOTE — Telephone Encounter (Signed)
Called patient and he will come in the next 15 mins for lab test and order put in

## 2021-10-27 NOTE — Telephone Encounter (Signed)
-----   Message from Derl Barrow, [place] sent at 10/27/2021 12:39 PM EDT ----- Regarding: Donnel Saxon Patient restarted [name] today and received a sample. I did not schedule for next appointment pending insurance approval. Patient is aware.

## 2021-10-27 NOTE — Progress Notes (Signed)
[place] - [place] - [place] - [place] - [place]   Follow-up Note  Referring Provider: Cleatis Polka., MD Primary Provider: Cleatis Polka., MD Date of Office Visit: 10/27/2021  Subjective:   Jerry Mcgrath (DOB: 1955/10/20) is a 66 y.o. 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  I last saw him in this clinic on 04 November 2020.  He discontinued his benralizumab in the fall 2022 and did relatively well for several months but over the course of the past 4 to 5 months has been developing persistent wheezing and phlegm production and some cough.  He has noticed that he has some problems with shortness of breath when he is participating in golf.  He has also had some intermittent nasal congestion and some sneezing and itchy nose.  He has had problems with inhaled steroids in the past with the development of significant laryngitis and he will not use these medications at this point.  He has been consistently using montelukast and rarely uses some nasal steroids.  Allergies as of 10/27/2021   No Known Allergies      Medication List    albuterol 108 (90 Base) MCG/ACT inhaler Commonly known as: VENTOLIN HFA Inhale 2 puffs into the lungs every 6 (six) hours as needed for wheezing or shortness of breath.   atorvastatin 20 MG tablet Commonly known as: LIPITOR Take 20 mg by mouth daily.   cetirizine 10 MG tablet Commonly known as: ZYRTEC Take 1 tablet (10 mg total) by mouth daily.   EPINEPHrine 0.3 mg/0.3 mL Soaj injection Commonly known as: Auvi-Q Use as directed for severe allergic reaction   Fasenra Pen 30 MG/ML Soaj Generic drug: Benralizumab Inject 30 mg into the skin every 8 (eight) weeks.   montelukast 10 MG tablet Commonly known as: SINGULAIR Take 1 tablet (10 mg total) by mouth at bedtime.    triamcinolone 55 MCG/ACT Aero nasal inhaler Commonly known as: NASACORT Place 2 sprays into the nose daily.    Past Medical History:  Diagnosis Date   Asthma    triggers by seasonal changes   Colonic polyp    Gout    Hyperlipemia    Hypertension    IFG (impaired fasting glucose)    Obesity    OSA (obstructive sleep apnea)     Past Surgical History:  Procedure Laterality Date   MASS EXCISION Left 07/31/2020   Procedure: EXCISION SOFT TISSUE MASS LEFT POSTERIOR NECK AND SHOULDER;  Surgeon: Darnell Level, MD;  Location: [facility];  Service: General;  Laterality: Left;   WISDOM TOOTH EXTRACTION      Review of systems negative except as noted in HPI / PMHx or noted below:  Review of Systems  Constitutional: Negative.   HENT: Negative.    Eyes: Negative.   Respiratory: Negative.    Cardiovascular: Negative.   Gastrointestinal: Negative.   Genitourinary: Negative.   Musculoskeletal: Negative.   Skin: Negative.   Neurological: Negative.   Endo/Heme/Allergies: Negative.   Psychiatric/Behavioral: Negative.       Objective:   Vitals:   10/27/21 0859  BP: 130/80  Pulse: (!) 54  Resp: 18  Temp: 97.9 F (36.6 C)  SpO2: 96%   Height: 5\' 10"  (177.8 cm)  Weight: 268 lb 12.8 oz (121.9 kg)   Physical Exam Constitutional:  Appearance: He is not diaphoretic.  HENT:     Head: Normocephalic.     Right Ear: Tympanic membrane, ear canal and external ear normal.     Left Ear: Tympanic membrane, ear canal and external ear normal.     Nose: Nose normal. No mucosal edema or rhinorrhea.     Mouth/Throat:     Pharynx: Uvula midline. No oropharyngeal exudate.  Eyes:     Conjunctiva/sclera: Conjunctivae normal.  Neck:     Thyroid: No thyromegaly.     Trachea: Trachea normal. No tracheal tenderness or tracheal deviation.  Cardiovascular:     Rate and Rhythm: Normal rate and regular rhythm.     Heart sounds: Normal heart sounds, S1 normal and S2 normal. No murmur  heard. Pulmonary:     Effort: No respiratory distress.     Breath sounds: Normal breath sounds. No stridor. No wheezing or rales.  Lymphadenopathy:     Head:     Right side of head: No tonsillar adenopathy.     Left side of head: No tonsillar adenopathy.     Cervical: No cervical adenopathy.  Skin:    Findings: No erythema or rash.     Nails: There is no clubbing.  Neurological:     Mental Status: He is alert.     Diagnostics:    Spirometry was performed and demonstrated an FEV1 of 1.54 at 47 % of predicted.  His previous FEV1 was 2.12.  The patient had an Asthma Control Test with the following results: ACT Total Score: 20.    Assessment and Plan:   1. Not well controlled severe persistent asthma   2. Other allergic rhinitis    1.  Restart benralizumab AUTOINJECTOR. Sample administered in clinic today   2. If needed:    A.  ProAir HFA 2 puffs every 4-6 hours   B.  Nasal saline  C.  OTC Mucinex DM 1-2 tablets 1-2 times per day  D.  Over-the-counter Zyrtec 10 mg 1 tablet 1 time per day  E.  Montelukast 10 mg - 1 tablet 1 time per day  F.  OTC Nasacort - 1-2 sprays each nostril 1 time per day  3. Obtain fall flu vaccine and RSV vaccine  4. Return to clinic in 12 months or earlier if problem  We will restart Jerry Mcgrath on benralizumab and if history is any guide he should do great with resolution of all of his respiratory tract symptoms while utilizing this biologic agent.  He has a selection of other agents to be utilized should they be required as noted above.  Assuming he does well with this plan I will see him back in this clinic in 1 year or earlier if there is a problem.   Laurette Schimke, MD Allergy / Immunology [facility]

## 2021-10-27 NOTE — Patient Instructions (Addendum)
  1.  Restart benralizumab AUTOINJECTOR. Sample administered in clinic today   2. If needed:    A.  ProAir HFA 2 puffs every 4-6 hours   B.  Nasal saline  C.  OTC Mucinex DM 1-2 tablets 1-2 times per day  D.  Over-the-counter Zyrtec 10 mg 1 tablet 1 time per day  E.  Montelukast 10 mg - 1 tablet 1 time per day  F.  OTC Nasacort - 1-2 sprays each nostril 1 time per day  3. Obtain fall flu vaccine and RSV vaccine  4. Return to clinic in 12 months or earlier if problem

## 2021-10-28 ENCOUNTER — Encounter: Payer: Self-pay | Admitting: Allergy and Immunology

## 2021-10-28 LAB — CBC WITH DIFFERENTIAL/PLATELET
Basophils Absolute: 0 10*3/uL (ref 0.0–0.2)
Basos: 0 %
EOS (ABSOLUTE): 0 10*3/uL (ref 0.0–0.4)
Eos: 0 %
Hematocrit: 44.2 % (ref 37.5–51.0)
Hemoglobin: 15 g/dL (ref 13.0–17.7)
Immature Grans (Abs): 0.1 10*3/uL (ref 0.0–0.1)
Immature Granulocytes: 1 %
Lymphocytes Absolute: 1.1 10*3/uL (ref 0.7–3.1)
Lymphs: 16 %
MCH: 32.3 pg (ref 26.6–33.0)
MCHC: 33.9 g/dL (ref 31.5–35.7)
MCV: 95 fL (ref 79–97)
Monocytes Absolute: 0.6 10*3/uL (ref 0.1–0.9)
Monocytes: 9 %
Neutrophils Absolute: 5.1 10*3/uL (ref 1.4–7.0)
Neutrophils: 74 %
Platelets: 217 10*3/uL (ref 150–450)
RBC: 4.65 x10E6/uL (ref 4.14–5.80)
RDW: 15.5 % — ABNORMAL HIGH (ref 11.6–15.4)
WBC: 6.8 10*3/uL (ref 3.4–10.8)

## 2021-11-06 DIAGNOSIS — G4733 Obstructive sleep apnea (adult) (pediatric): Secondary | ICD-10-CM | POA: Diagnosis not present

## 2021-11-08 IMAGING — CT CT CHEST LUNG CANCER SCREENING LOW DOSE W/O CM
2 of 5 series · 15 of 40 positions shown, 18 images · non-contrast
Comparison: 08/12/2016

CLINICAL DATA: 64-year-old male with 40 pack-year history of
smoking. Lung cancer screening.

EXAM:
CT CHEST WITHOUT CONTRAST LOW-DOSE FOR LUNG CANCER SCREENING
TECHNIQUE: Multidetector CT imaging of the chest was performed following the
standard protocol without IV contrast.

[Series 4: lung 1.00 br44 cor · coronal · 0.65mm/px · 3 of 359 slices shown]
[im 72/359  lung]
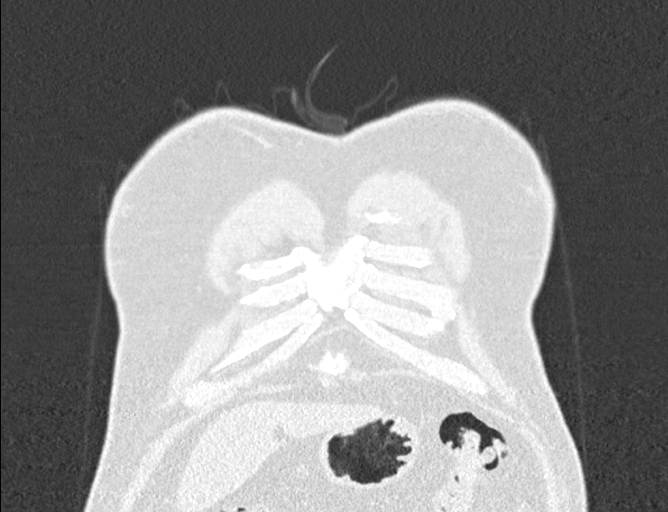
[im 144/359  lung]
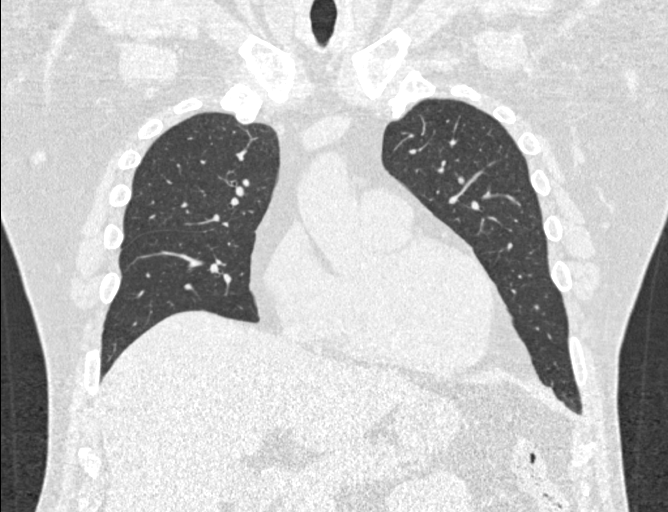
[im 215/359  lung]
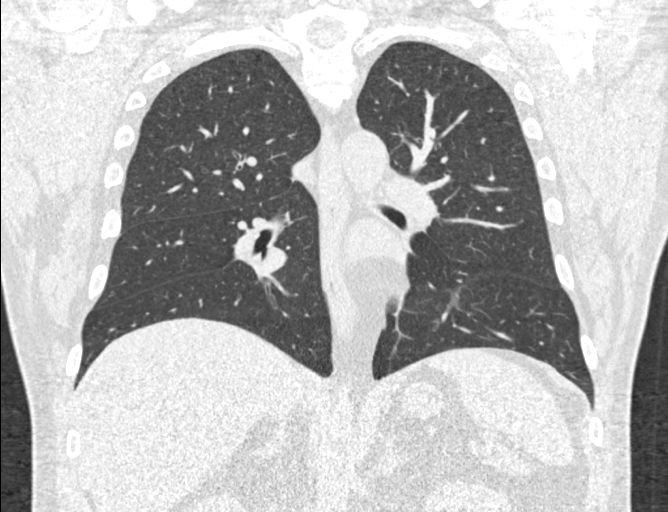

[Series 9: lung 1.00 br60 axial · axial · 0.84mm/px · z∈[-1102,-803]mm · 12 of 331 slices shown, 15 images]
[im 16/331  mediastinal]
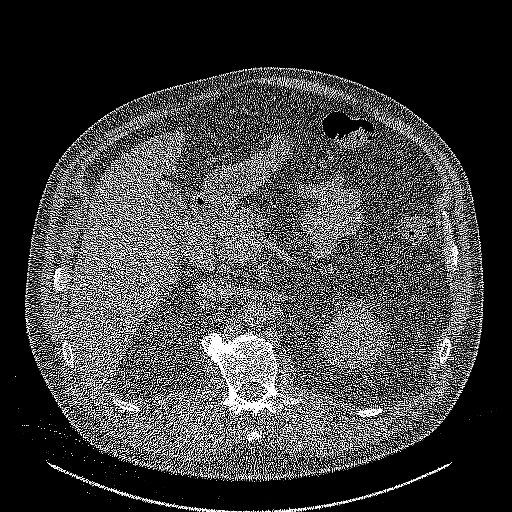
[im 16/331  lung]
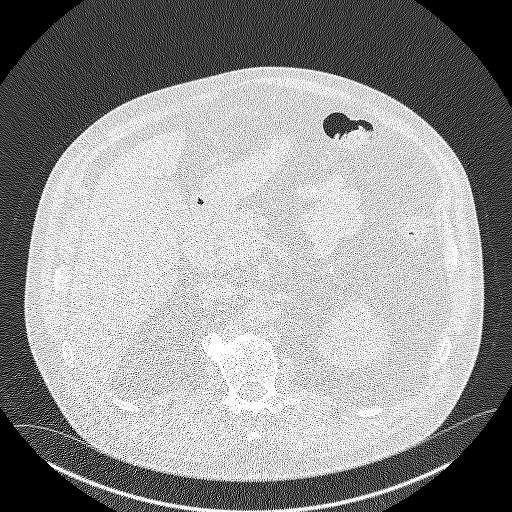
[im 48/331  lung]
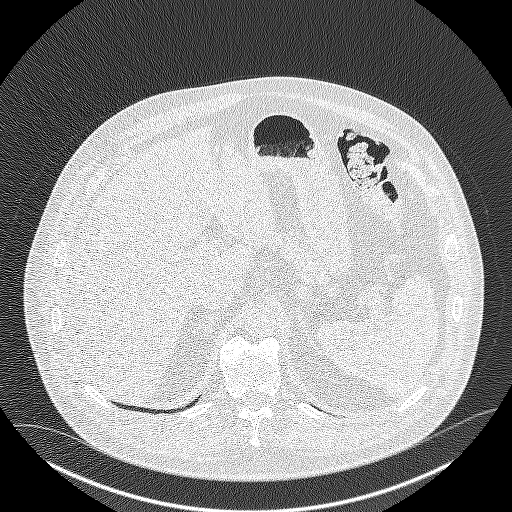
[im 79/331  lung]
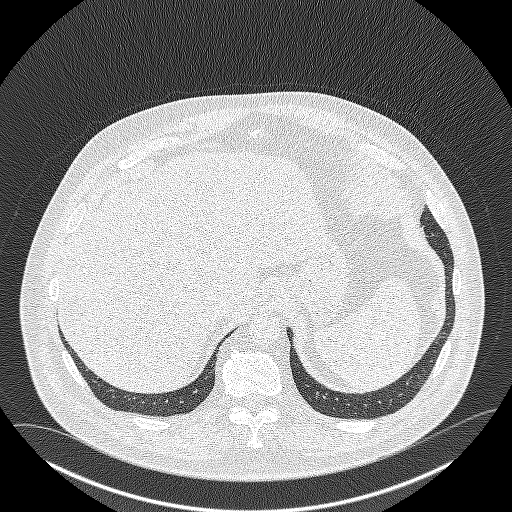
[im 95/331  lung]
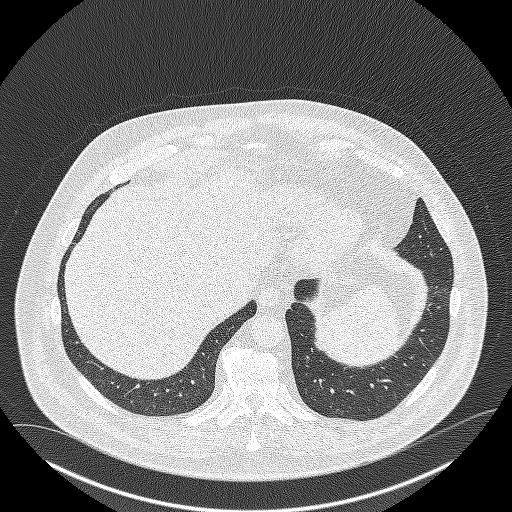
[im 126/331  mediastinal]
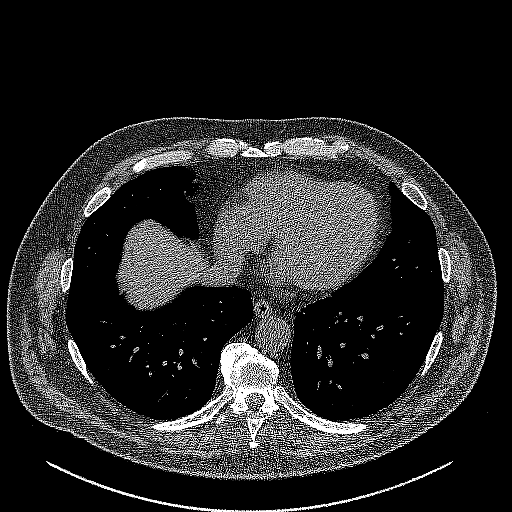
[im 126/331  lung]
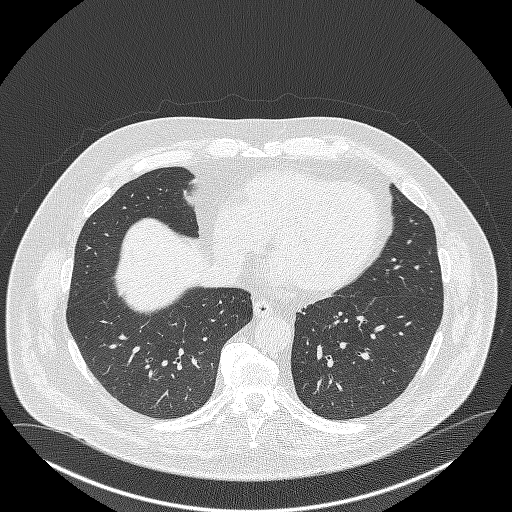
[im 158/331  lung]
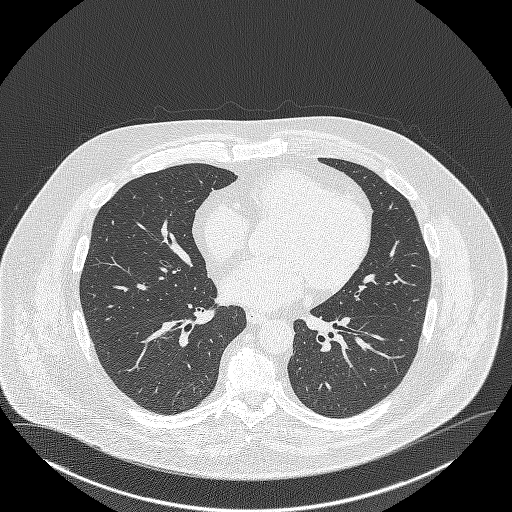
[im 173/331  lung]
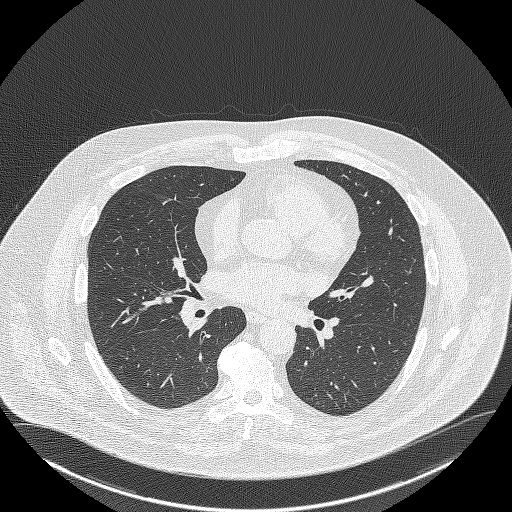
[im 205/331  lung]
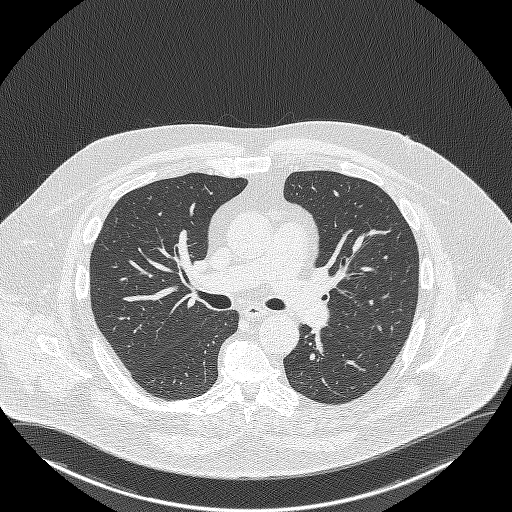
[im 236/331  mediastinal]
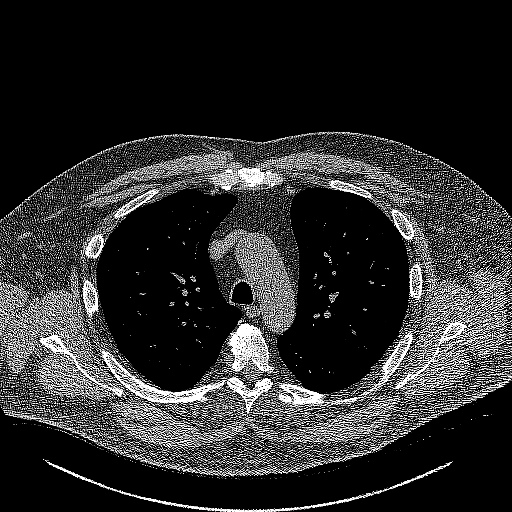
[im 236/331  lung]
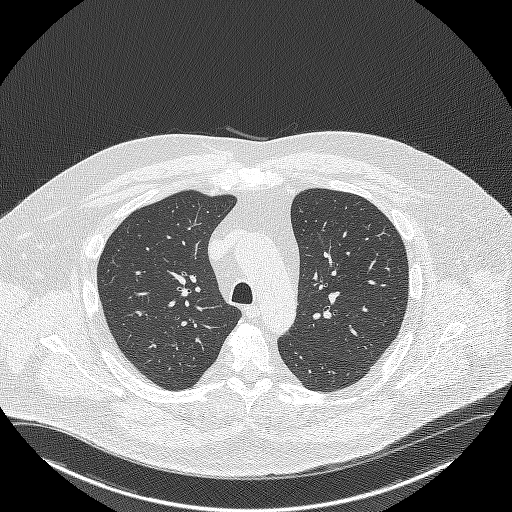
[im 252/331  lung]
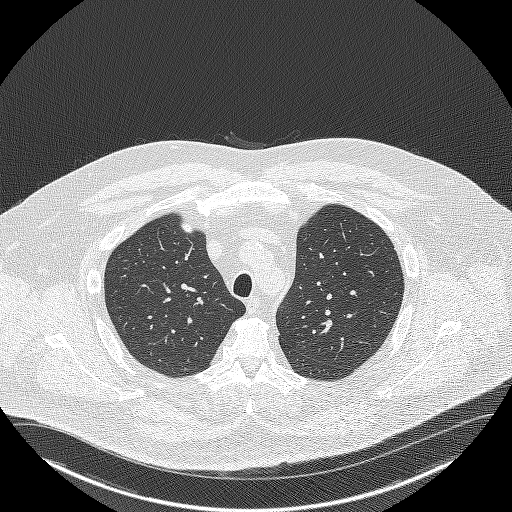
[im 283/331  lung]
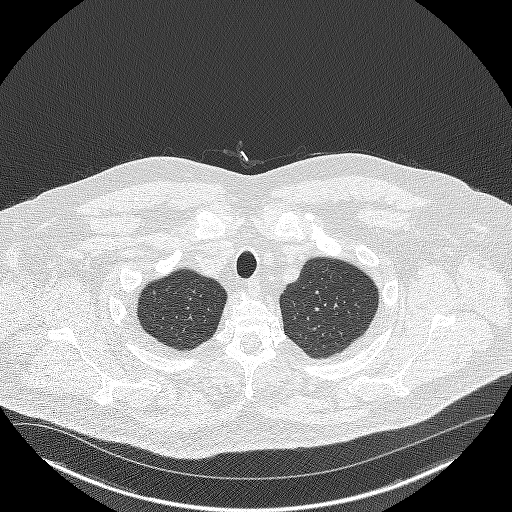
[im 315/331  lung]
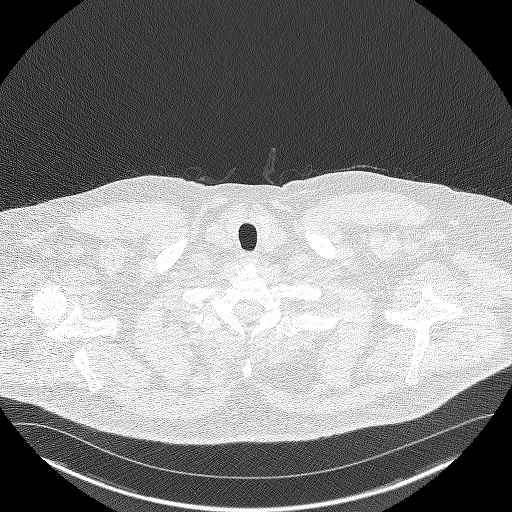

[15 of 40 positions shown; findings below may reference images not displayed]

FINDINGS: Cardiovascular: The heart size is normal. No substantial pericardial
effusion. Coronary artery calcification is evident. No thoracic
aortic aneurysm.

Mediastinum/Nodes: No mediastinal lymphadenopathy. No evidence for
gross hilar lymphadenopathy although assessment is limited by the
lack of intravenous contrast on today's study. The esophagus has
normal imaging features. There is no axillary lymphadenopathy.

Lungs/Pleura: Centrilobular emphsyema noted. Tiny calcified
granulomas noted right lung. No new suspicious pulmonary nodule or
mass. No focal airspace consolidation. No pleural effusion.

Upper Abdomen: 19 mm low-density subcapsular lesion in the dome of
the liver is stable consistent with benign etiology, likely a cyst.

Musculoskeletal: No worrisome lytic or sclerotic osseous
abnormality.
IMPRESSION: 1. Lung-RADS 1, negative. Continue annual screening with low-dose
chest CT without contrast in 12 months.
2. Coronary artery atherosclerosis.
3. Emphysema ([id number]).

## 2021-11-23 ENCOUNTER — Telehealth: Payer: Self-pay | Admitting: *Deleted

## 2021-11-23 NOTE — Telephone Encounter (Signed)
-----   Message from Derl Barrow, [place] sent at 10/27/2021 12:39 PM EDT ----- Regarding: Donnel Saxon Patient restarted [name] today and received a sample. I did not schedule for next appointment pending insurance approval. Patient is aware.

## 2021-11-23 NOTE — Telephone Encounter (Signed)
Called patient and advised approval, copay card and submit for Tezspire. Took longer due to appeal to [facility]. Advised patient on dosing and instrux to bring same into clinic for initial injection

## 2021-11-27 ENCOUNTER — Ambulatory Visit: Payer: BC Managed Care – PPO

## 2021-11-27 DIAGNOSIS — J4550 Severe persistent asthma, uncomplicated: Secondary | ICD-10-CM

## 2021-11-27 NOTE — Progress Notes (Signed)
Teaching was provided for patient to self administer Tezspire injection at home. Instructions given and patient verbalized understanding

## 2021-12-04 ENCOUNTER — Telehealth: Payer: Self-pay | Admitting: *Deleted

## 2021-12-04 NOTE — Chronic Care Management (AMB) (Signed)
  Care Coordination   Note   12/04/2021 Name: RIPLEY BOGOSIAN MRN: [id number] DOB: Oct 26, 1955  DEMARLO RIOJAS is a 66 y.o. year old male who sees Brigitte Pulse, Emily Filbert., MD for primary care. I reached out to [name] by phone today to offer care coordination services.  Mr. Baetz was given information about Care Coordination services today including:   The Care Coordination services include support from the care team which includes your Nurse Coordinator, Clinical Social Worker, or Pharmacist.  The Care Coordination team is here to help remove barriers to the health concerns and goals most important to you. Care Coordination services are voluntary, and the patient may decline or stop services at any time by request to their care team member.   Care Coordination Consent Status: Patient agreed to services and verbal consent obtained.   Follow up plan:  Telephone appointment with care coordination team member scheduled for:  12/07/21  Encounter Outcome:  Pt. Scheduled  [place]  Direct Dial: [phone]

## 2021-12-07 ENCOUNTER — Ambulatory Visit: Payer: Self-pay

## 2021-12-07 DIAGNOSIS — G4733 Obstructive sleep apnea (adult) (pediatric): Secondary | ICD-10-CM | POA: Diagnosis not present

## 2021-12-07 NOTE — Patient Outreach (Signed)
  Care Coordination   Initial Visit Note   12/07/2021 Name: Jerry Mcgrath MRN: [id number] DOB: Nov 25, 1955  Jerry Mcgrath is a 66 y.o. year old male who sees Brigitte Pulse, Emily Filbert., MD for primary care. I spoke with  [name] by phone today.  What matters to the patients health and wellness today?  No concerns, doing well    Goals Addressed             This Visit's Progress    COMPLETED: Care Coordination Activities       Care Coordination Interventions: SDoH screening performed - no acute resource needs identified Determined the patient does have an advance directive - discussed importance of sharing a copy with his healthcare provider Discussed the patient does not have concerns with medication costs and/or adherence Performed chart review to note recent MyChart message from [facility] requesting patient schedule an appointment in order to refill CPAP supplies Discussed the patient has not scheduled an appointment but did purchase supplies out of pocket and is continuing to use his CPAP machine nightly Reviewed role of care coordination team - no follow up needed at this time Instructed patient to contact his primary care provider as needed         SDOH assessments and interventions completed:  Yes  SDOH Interventions Today    Flowsheet Row Most Recent Value  SDOH Interventions   Food Insecurity Interventions Intervention Not Indicated  Housing Interventions Intervention Not Indicated  Transportation Interventions Intervention Not Indicated        Care Coordination Interventions Activated:  Yes  Care Coordination Interventions:  Yes, provided   Follow up plan: No further intervention required.   Encounter Outcome:  Pt. Visit Completed   Daneen Schick, BSW, CDP Social Worker, Certified Dementia Practitioner Care Coordination [phone]

## 2021-12-07 NOTE — Patient Instructions (Signed)
Visit Information  Thank you for taking time to visit with me today. Please don't hesitate to contact me if I can be of assistance to you.   Following are the goals we discussed today:   Goals Addressed             This Visit's Progress    COMPLETED: Care Coordination Activities       Care Coordination Interventions: SDoH screening performed - no acute resource needs identified Determined the patient does have an advance directive - discussed importance of sharing a copy with his healthcare provider Discussed the patient does not have concerns with medication costs and/or adherence Performed chart review to note recent MyChart message from [facility] requesting patient schedule an appointment in order to refill CPAP supplies Discussed the patient has not scheduled an appointment but did purchase supplies out of pocket and is continuing to use his CPAP machine nightly Reviewed role of care coordination team - no follow up needed at this time Instructed patient to contact his primary care provider as needed          Please call the care guide team at [phone] if you need to schedule an appointment with our care coordination team.  If you are experiencing a Mental Health or [place] or need someone to talk to, please call [phone]-TALK (toll free, 24 hour hotline)  Patient verbalizes understanding of instructions and care plan provided today and agrees to view in [place]. Active MyChart status and patient understanding of how to access instructions and care plan via MyChart confirmed with patient.     No further follow up required: Please contact your primary care provider as needed.  Daneen Schick, BSW, CDP Social Worker, Certified Dementia Practitioner Care Coordination [phone]

## 2021-12-21 DIAGNOSIS — H5203 Hypermetropia, bilateral: Secondary | ICD-10-CM | POA: Diagnosis not present

## 2022-02-06 DIAGNOSIS — Z23 Encounter for immunization: Secondary | ICD-10-CM | POA: Diagnosis not present

## 2022-03-11 DIAGNOSIS — M109 Gout, unspecified: Secondary | ICD-10-CM | POA: Diagnosis not present

## 2022-03-11 DIAGNOSIS — I10 Essential (primary) hypertension: Secondary | ICD-10-CM | POA: Diagnosis not present

## 2022-03-11 DIAGNOSIS — E291 Testicular hypofunction: Secondary | ICD-10-CM | POA: Diagnosis not present

## 2022-03-11 DIAGNOSIS — Z125 Encounter for screening for malignant neoplasm of prostate: Secondary | ICD-10-CM | POA: Diagnosis not present

## 2022-03-11 DIAGNOSIS — Z1212 Encounter for screening for malignant neoplasm of rectum: Secondary | ICD-10-CM | POA: Diagnosis not present

## 2022-03-18 DIAGNOSIS — I2510 Atherosclerotic heart disease of native coronary artery without angina pectoris: Secondary | ICD-10-CM | POA: Diagnosis not present

## 2022-03-18 DIAGNOSIS — Z0000 Encounter for general adult medical examination without abnormal findings: Secondary | ICD-10-CM | POA: Diagnosis not present

## 2022-03-18 DIAGNOSIS — R82998 Other abnormal findings in urine: Secondary | ICD-10-CM | POA: Diagnosis not present

## 2022-03-18 DIAGNOSIS — I10 Essential (primary) hypertension: Secondary | ICD-10-CM | POA: Diagnosis not present

## 2022-03-18 DIAGNOSIS — Z1331 Encounter for screening for depression: Secondary | ICD-10-CM | POA: Diagnosis not present

## 2022-03-18 DIAGNOSIS — Z1339 Encounter for screening examination for other mental health and behavioral disorders: Secondary | ICD-10-CM | POA: Diagnosis not present

## 2022-03-18 DIAGNOSIS — R7301 Impaired fasting glucose: Secondary | ICD-10-CM | POA: Diagnosis not present

## 2022-03-19 ENCOUNTER — Other Ambulatory Visit: Payer: Self-pay | Admitting: Internal Medicine

## 2022-03-19 DIAGNOSIS — F17211 Nicotine dependence, cigarettes, in remission: Secondary | ICD-10-CM

## 2022-04-20 ENCOUNTER — Ambulatory Visit (INDEPENDENT_AMBULATORY_CARE_PROVIDER_SITE_OTHER): Payer: BC Managed Care – PPO | Admitting: Allergy and Immunology

## 2022-04-20 ENCOUNTER — Other Ambulatory Visit: Payer: Self-pay

## 2022-04-20 ENCOUNTER — Encounter: Payer: Self-pay | Admitting: Allergy and Immunology

## 2022-04-20 VITALS — BP 142/88 | HR 55 | Temp 97.7°F | Resp 16 | Ht 70.0 in | Wt 261.2 lb

## 2022-04-20 DIAGNOSIS — J4550 Severe persistent asthma, uncomplicated: Secondary | ICD-10-CM

## 2022-04-20 DIAGNOSIS — J3089 Other allergic rhinitis: Secondary | ICD-10-CM

## 2022-04-20 NOTE — Patient Instructions (Signed)
  1.  Tezepelumab AUTOINJECTOR every 4 weeks. Sample provided in clinic today   2.  Montelukast 10 mg - 1 tablet 1 time per day  3. If needed:    A.  ProAir HFA 2 puffs every 4-6 hours   B.  Nasal saline  C.  OTC Mucinex DM 1-2 tablets 1-2 times per day  D.  Over-the-counter Zyrtec 10 mg 1 tablet 1 time per day  E.  OTC Nasacort - 1-2 sprays each nostril 1 time per day  3. Obtain RSV vaccine  4. Return to clinic in 12 months or earlier if problem

## 2022-04-20 NOTE — Progress Notes (Unsigned)
[place] [facility] [place] [facility] [place]   Follow-up Note  Referring Provider: Ginger Organ., MD Primary Provider: Ginger Organ., MD Date of Office Visit: 04/20/2022  Subjective:   Jerry Mcgrath (DOB: [birth year]) is a 67 y.o. male who returns to the [facility] on 04/20/2022 in re-evaluation of the following:  HPI: Jerry Mcgrath returns to this clinic in evaluation of asthma and allergic rhinitis.  I last saw him in this clinic 27 October 2021.  When using tezepelumab injections he has had an excellent response regarding control of his respiratory tract disease.  He rarely uses the short acting bronchodilator and has not required a systemic steroid or antibiotic for any type of airway issue.  He does continue to use montelukast in conjunction with his tezepelumab.  However, because of an insurance issue, he has been out of his tezepelumab and he is already noticed that he is getting some nasal congestion and possibly a little bit of cough.  He did receive the flu vaccine this year.  He tells me that he is on an injectable GLP-1 agonist for weight loss.  Allergies as of 04/20/2022   No Known Allergies      Medication List    albuterol 108 (90 Base) MCG/ACT inhaler Commonly known as: VENTOLIN HFA Inhale 2 puffs into the lungs every 6 (six) hours as needed for wheezing or shortness of breath.   atorvastatin 20 MG tablet Commonly known as: LIPITOR Take 20 mg by mouth daily.   Auvi-Q 0.3 mg/0.3 mL Soaj injection Generic drug: EPINEPHrine Use as directed for severe allergic reaction   cetirizine 10 MG tablet Commonly known as: ZYRTEC Take 1 tablet (10 mg total) by mouth daily.   Fasenra Pen 30 MG/ML Soaj Generic drug: Benralizumab Inject 30 mg into the skin every 8 (eight) weeks.   montelukast 10 MG tablet Commonly known as: SINGULAIR Take 1 tablet (10 mg total) by mouth at bedtime.   triamcinolone 55 MCG/ACT Aero nasal  inhaler Commonly known as: NASACORT Place 2 sprays into the nose daily.    Past Medical History:  Diagnosis Date   Asthma    triggers by seasonal changes   Colonic polyp    Gout    Hyperlipemia    Hypertension    IFG (impaired fasting glucose)    Obesity    OSA (obstructive sleep apnea)     Past Surgical History:  Procedure Laterality Date   MASS EXCISION Left 07/31/2020   Procedure: EXCISION SOFT TISSUE MASS LEFT POSTERIOR NECK AND SHOULDER;  Surgeon: Armandina Gemma, MD;  Location: [facility];  Service: General;  Laterality: Left;   WISDOM TOOTH EXTRACTION      Review of systems negative except as noted in HPI / PMHx or noted below:  Review of Systems  Constitutional: Negative.   HENT: Negative.    Eyes: Negative.   Respiratory: Negative.    Cardiovascular: Negative.   Gastrointestinal: Negative.   Genitourinary: Negative.   Musculoskeletal: Negative.   Skin: Negative.   Neurological: Negative.   Endo/Heme/Allergies: Negative.   Psychiatric/Behavioral: Negative.       Objective:   Vitals:   04/20/22 1602  BP: (!) 142/88  Pulse: (!) 55  Resp: 16  Temp: 97.7 F (36.5 C)  SpO2: 98%   Height: '5\' 10"'$  (177.8 cm)  Weight: 261 lb 3.2 oz (118.5 kg)   Physical Exam Constitutional:      Appearance: He is not diaphoretic.  HENT:     Head: Normocephalic.     Right Ear: Tympanic membrane, ear canal and external ear normal.     Left Ear: Tympanic membrane, ear canal and external ear normal.     Nose: Nose normal. No mucosal edema or rhinorrhea.     Mouth/Throat:     Pharynx: Uvula midline. No oropharyngeal exudate.  Eyes:     Conjunctiva/sclera: Conjunctivae normal.  Neck:     Thyroid: No thyromegaly.     Trachea: Trachea normal. No tracheal tenderness or tracheal deviation.  Cardiovascular:     Rate and Rhythm: Normal rate and regular rhythm.     Heart sounds: Normal heart sounds, S1 normal and S2 normal. No murmur heard. Pulmonary:     Effort: No respiratory  distress.     Breath sounds: Normal breath sounds. No stridor. No wheezing or rales.  Lymphadenopathy:     Head:     Right side of head: No tonsillar adenopathy.     Left side of head: No tonsillar adenopathy.     Cervical: No cervical adenopathy.  Skin:    Findings: No erythema or rash.     Nails: There is no clubbing.  Neurological:     Mental Status: He is alert.     Diagnostics:    Spirometry was performed and demonstrated an FEV1 of 1.37 at 41 % of predicted.  Assessment and Plan:   1. Not well controlled severe persistent asthma   2. Other allergic rhinitis    1.  Tezepelumab AUTOINJECTOR every 4 weeks. Sample provided in clinic today   2.  Montelukast 10 mg - 1 tablet 1 time per day  3. If needed:    A.  ProAir HFA 2 puffs every 4-6 hours   B.  Nasal saline  C.  OTC Mucinex DM 1-2 tablets 1-2 times per day  D.  Over-the-counter Zyrtec 10 mg 1 tablet 1 time per day  E.  OTC Nasacort - 1-2 sprays each nostril 1 time per day  3. Obtain RSV vaccine  4. Return to clinic in 12 months or earlier if problem  Jerry Mcgrath appears to require tezepelumab to maintain good control of his respiratory tract issues and I have given him a sample of a autoinjector today and he will continue to reestablish care with our biologic coordinator to receive this biologic agent every 4 weeks.  Assuming he does well with this plan I will see him back in this clinic in 1 year or earlier if there is a problem.   Allena Katz, MD Allergy / Immunology [place]

## 2022-04-21 ENCOUNTER — Encounter: Payer: Self-pay | Admitting: Allergy and Immunology

## 2022-04-27 ENCOUNTER — Ambulatory Visit
Admission: RE | Admit: 2022-04-27 | Discharge: 2022-04-27 | Disposition: A | Discharge status: Home / Self Care | Payer: BC Managed Care – PPO | Source: Ambulatory Visit | Attending: Internal Medicine | Admitting: Internal Medicine

## 2022-04-27 DIAGNOSIS — Z87891 Personal history of nicotine dependence: Secondary | ICD-10-CM | POA: Diagnosis not present

## 2022-04-27 DIAGNOSIS — F17211 Nicotine dependence, cigarettes, in remission: Secondary | ICD-10-CM

## 2022-06-21 DIAGNOSIS — E8881 Metabolic syndrome: Secondary | ICD-10-CM | POA: Diagnosis not present

## 2022-06-21 DIAGNOSIS — E785 Hyperlipidemia, unspecified: Secondary | ICD-10-CM | POA: Diagnosis not present

## 2022-06-21 DIAGNOSIS — I10 Essential (primary) hypertension: Secondary | ICD-10-CM | POA: Diagnosis not present

## 2022-06-26 ENCOUNTER — Other Ambulatory Visit: Payer: Self-pay | Admitting: Allergy and Immunology

## 2022-09-28 DIAGNOSIS — I10 Essential (primary) hypertension: Secondary | ICD-10-CM | POA: Diagnosis not present

## 2022-12-02 ENCOUNTER — Other Ambulatory Visit (HOSPITAL_COMMUNITY): Payer: Self-pay

## 2022-12-08 ENCOUNTER — Other Ambulatory Visit: Payer: Self-pay | Admitting: Allergy and Immunology

## 2023-01-04 ENCOUNTER — Other Ambulatory Visit: Payer: Self-pay | Admitting: Allergy and Immunology

## 2023-02-02 DIAGNOSIS — Z23 Encounter for immunization: Secondary | ICD-10-CM | POA: Diagnosis not present

## 2023-03-29 NOTE — Progress Notes (Signed)
   [address] [place] [address] Dept: [phone]  FOLLOW UP NOTE  Patient ID: Jerry Mcgrath, male    DOB: 25-Jun-1955  Age: 67 y.o. MRN: [id number] Date of Office Visit: 03/31/2023  Assessment  Chief Complaint: No chief complaint on file.  HPI Jerry Mcgrath is a 67 year old male who presents to the clinic for follow-up visit.  He was last seen in this clinic on 04/20/2022 by Dr. Lucie Leather for evaluation of asthma and allergic rhinitis.  His last environmental allergy skin testing was in 2012 and was positive to grass pollen, weed pollen, tree pollen, mold, cat, and dog. Discussed the use of AI scribe software for clinical note transcription with the patient, who gave verbal consent to proceed.  History of Present Illness             Drug Allergies:  No Known Allergies  Physical Exam: There were no vitals taken for this visit.   Physical Exam  Diagnostics:    Assessment and Plan: No diagnosis found.  No orders of the defined types were placed in this encounter.   There are no Patient Instructions on file for this visit.  No follow-ups on file.    Thank you for the opportunity to care for this patient.  Please do not hesitate to contact me with questions.  Thermon Leyland, FNP [facility]

## 2023-03-29 NOTE — Patient Instructions (Incomplete)
  1.  Tezepelumab AUTOINJECTOR every 4 weeks. Sample provided in clinic today   2.  Montelukast 10 mg - 1 tablet 1 time per day  3. If needed:    A.  ProAir HFA 2 puffs every 4-6 hours   B.  Nasal saline  C.  OTC Mucinex DM 1-2 tablets 1-2 times per day  D.  Over-the-counter Zyrtec 10 mg 1 tablet 1 time per day  E.  OTC Nasacort - 1-2 sprays each nostril 1 time per day  3. Obtain RSV vaccine  4. Return to clinic in 12 months or earlier if problem

## 2023-03-31 ENCOUNTER — Ambulatory Visit: Payer: BC Managed Care – PPO | Admitting: Family Medicine

## 2023-03-31 ENCOUNTER — Encounter: Payer: Self-pay | Admitting: Family Medicine

## 2023-03-31 VITALS — BP 140/60 | HR 52 | Temp 97.3°F | Resp 16 | Wt 236.9 lb

## 2023-03-31 DIAGNOSIS — J3089 Other allergic rhinitis: Secondary | ICD-10-CM

## 2023-03-31 DIAGNOSIS — J302 Other seasonal allergic rhinitis: Secondary | ICD-10-CM

## 2023-03-31 DIAGNOSIS — M109 Gout, unspecified: Secondary | ICD-10-CM | POA: Diagnosis not present

## 2023-03-31 DIAGNOSIS — E291 Testicular hypofunction: Secondary | ICD-10-CM | POA: Diagnosis not present

## 2023-03-31 DIAGNOSIS — Z125 Encounter for screening for malignant neoplasm of prostate: Secondary | ICD-10-CM | POA: Diagnosis not present

## 2023-03-31 DIAGNOSIS — R7301 Impaired fasting glucose: Secondary | ICD-10-CM | POA: Diagnosis not present

## 2023-03-31 DIAGNOSIS — E785 Hyperlipidemia, unspecified: Secondary | ICD-10-CM | POA: Diagnosis not present

## 2023-03-31 DIAGNOSIS — J4550 Severe persistent asthma, uncomplicated: Secondary | ICD-10-CM | POA: Diagnosis not present

## 2023-03-31 MED ORDER — MONTELUKAST SODIUM 10 MG PO TABS: 10.0000 mg | ORAL_TABLET | Freq: Every day | ORAL | 5 refills | Status: DC

## 2023-04-07 DIAGNOSIS — Z1331 Encounter for screening for depression: Secondary | ICD-10-CM | POA: Diagnosis not present

## 2023-04-07 DIAGNOSIS — R82998 Other abnormal findings in urine: Secondary | ICD-10-CM | POA: Diagnosis not present

## 2023-04-07 DIAGNOSIS — Z1339 Encounter for screening examination for other mental health and behavioral disorders: Secondary | ICD-10-CM | POA: Diagnosis not present

## 2023-04-07 DIAGNOSIS — I10 Essential (primary) hypertension: Secondary | ICD-10-CM | POA: Diagnosis not present

## 2023-04-07 DIAGNOSIS — Z0000 Encounter for general adult medical examination without abnormal findings: Secondary | ICD-10-CM | POA: Diagnosis not present

## 2023-06-01 DIAGNOSIS — Z860100 Personal history of colon polyps, unspecified: Secondary | ICD-10-CM | POA: Diagnosis not present

## 2023-06-01 DIAGNOSIS — D128 Benign neoplasm of rectum: Secondary | ICD-10-CM | POA: Diagnosis not present

## 2023-06-01 DIAGNOSIS — K635 Polyp of colon: Secondary | ICD-10-CM | POA: Diagnosis not present

## 2023-06-01 DIAGNOSIS — K5730 Diverticulosis of large intestine without perforation or abscess without bleeding: Secondary | ICD-10-CM | POA: Diagnosis not present

## 2023-06-01 DIAGNOSIS — Z09 Encounter for follow-up examination after completed treatment for conditions other than malignant neoplasm: Secondary | ICD-10-CM | POA: Diagnosis not present

## 2023-09-29 DIAGNOSIS — M25511 Pain in right shoulder: Secondary | ICD-10-CM | POA: Diagnosis not present

## 2023-10-18 DIAGNOSIS — M7541 Impingement syndrome of right shoulder: Secondary | ICD-10-CM | POA: Diagnosis not present

## 2023-10-18 DIAGNOSIS — M6281 Muscle weakness (generalized): Secondary | ICD-10-CM | POA: Diagnosis not present

## 2023-11-01 DIAGNOSIS — M6281 Muscle weakness (generalized): Secondary | ICD-10-CM | POA: Diagnosis not present

## 2023-11-01 DIAGNOSIS — M7541 Impingement syndrome of right shoulder: Secondary | ICD-10-CM | POA: Diagnosis not present

## 2023-11-09 DIAGNOSIS — M7541 Impingement syndrome of right shoulder: Secondary | ICD-10-CM | POA: Diagnosis not present

## 2023-11-09 DIAGNOSIS — M6281 Muscle weakness (generalized): Secondary | ICD-10-CM | POA: Diagnosis not present

## 2024-01-05 ENCOUNTER — Other Ambulatory Visit: Payer: Self-pay | Admitting: *Deleted

## 2024-01-05 MED ORDER — TEZSPIRE 210 MG/1.91ML ~~LOC~~ SOAJ: 210.0000 mg | SUBCUTANEOUS | 11 refills | Status: AC

## 2024-02-20 ENCOUNTER — Other Ambulatory Visit: Payer: Self-pay | Admitting: Family Medicine

## 2024-02-20 DIAGNOSIS — J4550 Severe persistent asthma, uncomplicated: Secondary | ICD-10-CM
